# Patient Record
Sex: Male | Born: 1960 | Race: Black or African American | Hispanic: No | Marital: Single | Smoking: Never smoker
Health system: Southern US, Community
[De-identification: ages and names within clinical notes are randomized; demographics above are authoritative.]

## PROBLEM LIST (undated history)

## (undated) DIAGNOSIS — K219 Gastro-esophageal reflux disease without esophagitis: Secondary | ICD-10-CM

## (undated) DIAGNOSIS — T7840XA Allergy, unspecified, initial encounter: Secondary | ICD-10-CM

## (undated) HISTORY — DX: Gastro-esophageal reflux disease without esophagitis: K21.9

## (undated) HISTORY — DX: Allergy, unspecified, initial encounter: T78.40XA

## (undated) HISTORY — PX: TUMOR REMOVAL: SHX12

---

## 1997-10-30 ENCOUNTER — Encounter: Admission: RE | Admit: 1997-10-30 | Discharge: 1997-10-30 | Payer: Self-pay | Admitting: *Deleted

## 2004-07-13 ENCOUNTER — Emergency Department (HOSPITAL_COMMUNITY): Admission: EM | Admit: 2004-07-13 | Discharge: 2004-07-13 | Payer: Self-pay | Admitting: Family Medicine

## 2004-08-10 ENCOUNTER — Emergency Department (HOSPITAL_COMMUNITY): Admission: EM | Admit: 2004-08-10 | Discharge: 2004-08-10 | Payer: Self-pay | Admitting: Family Medicine

## 2004-10-19 ENCOUNTER — Emergency Department (HOSPITAL_COMMUNITY): Admission: EM | Admit: 2004-10-19 | Discharge: 2004-10-19 | Payer: Self-pay | Admitting: Family Medicine

## 2004-11-01 ENCOUNTER — Emergency Department (HOSPITAL_COMMUNITY): Admission: EM | Admit: 2004-11-01 | Discharge: 2004-11-01 | Payer: Self-pay | Admitting: Family Medicine

## 2005-03-29 ENCOUNTER — Emergency Department (HOSPITAL_COMMUNITY): Admission: EM | Admit: 2005-03-29 | Discharge: 2005-03-29 | Payer: Self-pay | Admitting: Family Medicine

## 2005-04-03 ENCOUNTER — Emergency Department (HOSPITAL_COMMUNITY): Admission: EM | Admit: 2005-04-03 | Discharge: 2005-04-03 | Payer: Self-pay | Admitting: Family Medicine

## 2005-04-04 ENCOUNTER — Emergency Department (HOSPITAL_COMMUNITY): Admission: EM | Admit: 2005-04-04 | Discharge: 2005-04-04 | Payer: Self-pay | Admitting: Emergency Medicine

## 2009-04-03 ENCOUNTER — Ambulatory Visit: Payer: Self-pay | Admitting: Internal Medicine

## 2009-04-03 DIAGNOSIS — R21 Rash and other nonspecific skin eruption: Secondary | ICD-10-CM

## 2009-04-03 DIAGNOSIS — D239 Other benign neoplasm of skin, unspecified: Secondary | ICD-10-CM | POA: Insufficient documentation

## 2009-04-03 DIAGNOSIS — N401 Enlarged prostate with lower urinary tract symptoms: Secondary | ICD-10-CM

## 2009-04-06 ENCOUNTER — Telehealth: Payer: Self-pay | Admitting: Internal Medicine

## 2009-04-07 ENCOUNTER — Telehealth: Payer: Self-pay | Admitting: Internal Medicine

## 2009-04-19 ENCOUNTER — Emergency Department (HOSPITAL_COMMUNITY): Admission: EM | Admit: 2009-04-19 | Discharge: 2009-04-20 | Payer: Self-pay | Admitting: Emergency Medicine

## 2010-03-21 LAB — CONVERTED CEMR LAB
ALT: 40 units/L (ref 0–53)
AST: 31 units/L (ref 0–37)
Albumin: 4.5 g/dL (ref 3.5–5.2)
Alkaline Phosphatase: 103 units/L (ref 39–117)
BUN: 10 mg/dL (ref 6–23)
Basophils Absolute: 0.1 10*3/uL (ref 0.0–0.1)
Basophils Relative: 1.2 % (ref 0.0–3.0)
Bilirubin Urine: NEGATIVE
Cholesterol: 220 mg/dL — ABNORMAL HIGH (ref 0–200)
Creatinine, Ser: 1 mg/dL (ref 0.4–1.5)
Direct LDL: 149 mg/dL
Eosinophils Absolute: 0.1 10*3/uL (ref 0.0–0.7)
Eosinophils Relative: 1.4 % (ref 0.0–5.0)
GFR calc non Af Amer: 101.81 mL/min (ref >60)
Glucose, Bld: 94 mg/dL (ref 70–99)
HDL: 52.9 mg/dL (ref >39.00)
Ketones, ur: NEGATIVE mg/dL
Lymphocytes Relative: 28.6 % (ref 12.0–46.0)
Lymphs Abs: 1.6 10*3/uL (ref 0.7–4.0)
Neutro Abs: 3.3 10*3/uL (ref 1.4–7.7)
Neutrophils Relative %: 60.9 % (ref 43.0–77.0)
Nitrite: NEGATIVE
PSA: 1.22 ng/mL (ref 0.10–4.00)
RBC: 5.38 M/uL (ref 4.22–5.81)
TSH: 0.87 microintl units/mL (ref 0.35–5.50)
Urine Glucose: NEGATIVE mg/dL
pH: 6.5 (ref 5.0–8.0)

## 2010-03-23 NOTE — Letter (Signed)
Summary: Lipid Letter  [facility]  [address] [place], [place] [address]   Phone: [phone]  Fax: [phone]    04/03/2009  Jon Holmes [address] [place], [place]  [address]  Dear Jon Holmes:  We have carefully reviewed your last lipid profile from  and the results are noted below with a summary of recommendations for lipid management.    Cholesterol:       220     Goal: <200   HDL "good" Cholesterol:   41.74     Goal: >40   LDL "bad" Cholesterol:   149     Goal: <130   Triglycerides:       66.0     Goal: <150    really good results    TLC Diet (Therapeutic Lifestyle Change): Saturated Fats & Transfatty acids should be kept < 7% of total calories ***Reduce Saturated Fats Polyunstaurated Fat can be up to 10% of total calories Monounsaturated Fat Fat can be up to 20% of total calories Total Fat should be no greater than 25-35% of total calories Carbohydrates should be 50-60% of total calories Protein should be approximately 15% of total calories Fiber should be at least 20-30 grams a day ***Increased fiber may help lower LDL Total Cholesterol should be < 200mg /day Consider adding plant stanol/sterols to diet (example: Benacol spread) ***A higher intake of unsaturated fat may reduce Triglycerides and Increase HDL    Adjunctive Measures (may lower LIPIDS and reduce risk of Heart Attack) include: Aerobic Exercise (20-30 minutes 3-4 times a week) Limit Alcohol Consumption Weight Reduction Aspirin 75-81 mg a day by mouth (if not allergic or contraindicated) Dietary Fiber 20-30 grams a day by mouth     Current Medications:  None If you have any questions, please call. We appreciate being able to work with you.   Sincerely,    [facility] Jon Grandchild MD

## 2010-03-23 NOTE — Progress Notes (Signed)
Summary: RESULTS  Phone Note Call from Patient   Summary of Call: See previous phone note, Patient is requesting results of labs. He called office again requesting results. Initial call taken by: Lamar Sprinkles, CMA,  April 07, 2009 3:10 PM  Follow-up for Phone Call        labs look great all are normal Follow-up by: Etta Grandchild MD,  April 07, 2009 6:49 PM  Additional Follow-up for Phone Call Additional follow up Details #1::        Patient notified per MD and advised that he should be getting result letter this week. Additional Follow-up by: Rock Nephew CMA,  April 09, 2009 9:20 AM

## 2010-03-23 NOTE — Assessment & Plan Note (Signed)
Summary: NEW PT PHY--MED COST--#-PKG--STC   Vital Signs:  Patient profile:   50 year old male Height:      69 inches Weight:      190 pounds BMI:     28.16 O2 Sat:      98 % on Room air Temp:     97.4 degrees F oral Pulse rate:   72 / minute Pulse rhythm:   regular Resp:     16 per minute BP sitting:   130 / 84  (left arm) Cuff size:   large  Vitals Entered By: Rock Nephew CMA (April 03, 2009 10:34 AM)  Nutrition Counseling: Patient's BMI is greater than 25 and therefore counseled on weight management options.  O2 Flow:  Room air CC: New pt cpx Is Patient Diabetic? No Pain Assessment Patient in pain? no        Primary Care Provider:  Etta Grandchild MD  CC:  New pt cpx.  History of Present Illness: New to me for a complete physical, he complains of abnormal moles on the palm of his left hand and his left abd. for 3-4 years. He also has a rash (dark spots) on both hands (palms).  Preventive Screening-Counseling & Management  Alcohol-Tobacco     Smoking Status: never  Caffeine-Diet-Exercise     Does Patient Exercise: yes      Drug Use:  no.    Current Medications (verified): 1)  None  Allergies (verified): No Known Drug Allergies  Past History:  Past Medical History: Unremarkable  Past Surgical History: cyst removed from right mid-back several years ago in [place].  Family History: Family History of Alcoholism/Addiction Family History Hypertension Family History of Stroke M 1st degree relative <50  Social History: Occupation: Sales executive Single Never Smoked Alcohol use-no Drug use-no Regular exercise-yes Smoking Status:  never Drug Use:  no Does Patient Exercise:  yes  Review of Systems       The patient complains of suspicious skin lesions.  The patient denies anorexia, fever, weight loss, weight gain, chest pain, syncope, dyspnea on exertion, peripheral edema, prolonged cough, headaches, hemoptysis, abdominal pain, melena,  hematochezia, severe indigestion/heartburn, hematuria, incontinence, genital sores, difficulty walking, depression, abnormal bleeding, enlarged lymph nodes, angioedema, and testicular masses.   General:  Denies chills, fatigue, fever, sweats, and weakness. GI:  Complains of constipation; denies abdominal pain, bloody stools, diarrhea, hemorrhoids, indigestion, loss of appetite, nausea, vomiting, vomiting blood, and yellowish skin color. GU:  Denies decreased libido, discharge, dysuria, erectile dysfunction, genital sores, hematuria, incontinence, nocturia, urinary frequency, and urinary hesitancy. Derm:  Complains of rash; denies changes in color of skin, changes in nail beds, dryness, excessive perspiration, flushing, hair loss, insect bite(s), itching, lesion(s), and poor wound healing.  Physical Exam  General:  Well-developed,well-nourished,in no acute distress; alert,appropriate and cooperative throughout examination Head:  Normocephalic and atraumatic without obvious abnormalities. No apparent alopecia or balding. Eyes:  No corneal or conjunctival inflammation noted. EOMI. Perrla. Funduscopic exam benign, without hemorrhages, exudates or papilledema. Vision grossly normal. Ears:  R ear normal and L ear normal.   Mouth:  Oral mucosa and oropharynx without lesions or exudates.  Teeth in good repair. Neck:  No deformities, masses, or tenderness noted. Chest Wall:  No deformities, masses, tenderness or gynecomastia noted. Breasts:  No masses or gynecomastia noted Lungs:  Normal respiratory effort, chest expands symmetrically. Lungs are clear to auscultation, no crackles or wheezes. Heart:  Normal rate and regular rhythm. S1 and S2 normal without gallop,  murmur, click, rub or other extra sounds. Abdomen:  Bowel sounds positive,abdomen soft and non-tender without masses, organomegaly or hernias noted. Rectal:  No external abnormalities noted. Normal sphincter tone. No rectal masses or tenderness.  heme neg. stool. Genitalia:  circumcised, no hydrocele, no varicocele, no scrotal masses, no cutaneous lesions, no urethral discharge, R testes atrophic, and L testes atrophic.   Prostate:  no nodules, no asymmetry, no induration, and 1+ enlarged.   Msk:  No deformity or scoliosis noted of thoracic or lumbar spine.   Pulses:  R and L carotid,radial,femoral,dorsalis pedis and posterior tibial pulses are full and equal bilaterally Extremities:  No clubbing, cyanosis, edema, or deformity noted with normal full range of motion of all joints.   Neurologic:  No cranial nerve deficits noted. Station and gait are normal. Plantar reflexes are down-going bilaterally. DTRs are symmetrical throughout. Sensory, motor and coordinative functions appear intact. Skin:  macular rash on both palms  and dysplastic nevi on left palm, left lower abd., and both ankles and feet.   Cervical Nodes:  no anterior cervical adenopathy and no posterior cervical adenopathy.   Axillary Nodes:  no R axillary adenopathy and no L axillary adenopathy.   Inguinal Nodes:  no R inguinal adenopathy and no L inguinal adenopathy.   Psych:  Cognition and judgment appear intact. Alert and cooperative with normal attention span and concentration. No apparent delusions, illusions, hallucinations Additional Exam:  EKG has some artifact but otherwise looks good.   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) Assessment New  Orders: Dermatology Referral (Derma)  Problem # 2:  RASH AND OTHER NONSPECIFIC SKIN ERUPTION (ICD-782.1) Assessment: New  Orders: Venipuncture (29562) TLB-Lipid Panel (80061-LIPID) TLB-BMP (Basic Metabolic Panel-BMET) (80048-METABOL) TLB-CBC Platelet - w/Differential (85025-CBCD) TLB-Hepatic/Liver Function Pnl (80076-HEPATIC) TLB-TSH (Thyroid Stimulating Hormone) (84443-TSH) TLB-PSA (Prostate Specific Antigen) (84153-PSA) TLB-Udip w/ Micro (81001-URINE) T-RPR (Syphilis)  (13086-57846)  Problem # 3:  ROUTINE GENERAL MEDICAL EXAM@HEALTH  CARE FACL (ICD-V70.0) Assessment: New  Orders: Venipuncture ([address]295) TLB-Lipid Panel (80061-LIPID) TLB-BMP (Basic Metabolic Panel-BMET) (80048-METABOL) TLB-CBC Platelet - w/Differential (85025-CBCD) TLB-Hepatic/Liver Function Pnl (80076-HEPATIC) TLB-TSH (Thyroid Stimulating Hormone) (84443-TSH) TLB-PSA (Prostate Specific Antigen) (84153-PSA) TLB-Udip w/ Micro (81001-URINE) T-RPR (Syphilis) (28413-24401) EKG w/ Interpretation (93000) Hemoccult Guaiac-1 spec.(in office) (82270)  Discussed using sunscreen, use of alcohol, drug use, self testicular exam, routine dental care, routine eye care, routine physical exam, seat belts, multiple vitamins,  and recommendations for immunizations.  Discussed exercise and checking cholesterol.  Discussed gun safety, safe sex, and contraception. Also recommend checking PSA.  Problem # 4:  HYPERTROPHY PROSTATE W/UR OBST & OTH LUTS (ICD-600.01) Assessment: New  Patient Instructions: 1)  Please schedule a follow-up appointment in 1 month. 2)  Schedule a colonoscopy/sigmoidoscopy to help detect colon cancer. 3)  If you could be exposed to sexually transmitted diseases, you should use a condom.   Immunization History:  Tetanus/Td Immunization History:    Tetanus/Td:  tdap (10/03/2006)  Not Administered:    Influenza Vaccine not given due to: declined

## 2010-03-23 NOTE — Progress Notes (Signed)
Summary: RESULTS  Phone Note Call from Patient Call back at Home Phone [phone] Call back at [facility] #   Summary of Call: Patient is requesting results of labs.  Initial call taken by: Lamar Sprinkles, CMA,  April 06, 2009 2:26 PM

## 2010-05-12 LAB — D-DIMER, QUANTITATIVE: D-Dimer, Quant: 0.34 ug/mL-FEU (ref 0.00–0.48)

## 2010-05-12 LAB — POCT CARDIAC MARKERS: Troponin i, poc: <0.05 ng/mL (ref 0.00–0.09)

## 2011-03-04 ENCOUNTER — Encounter (HOSPITAL_COMMUNITY): Payer: Self-pay | Admitting: *Deleted

## 2011-03-04 ENCOUNTER — Emergency Department (HOSPITAL_COMMUNITY)
Admission: EM | Admit: 2011-03-04 | Discharge: 2011-03-04 | Disposition: A | Discharge status: Home / Self Care | Payer: BC Managed Care – PPO | Attending: Emergency Medicine | Admitting: Emergency Medicine

## 2011-03-04 ENCOUNTER — Emergency Department (HOSPITAL_COMMUNITY): Payer: BC Managed Care – PPO

## 2011-03-04 DIAGNOSIS — L234 Allergic contact dermatitis due to dyes: Secondary | ICD-10-CM

## 2011-03-04 DIAGNOSIS — R220 Localized swelling, mass and lump, head: Secondary | ICD-10-CM | POA: Insufficient documentation

## 2011-03-04 DIAGNOSIS — K047 Periapical abscess without sinus: Secondary | ICD-10-CM | POA: Insufficient documentation

## 2011-03-04 DIAGNOSIS — L258 Unspecified contact dermatitis due to other agents: Secondary | ICD-10-CM | POA: Insufficient documentation

## 2011-03-04 MED ORDER — PENICILLIN V POTASSIUM 250 MG PO TABS: 250.0000 mg | ORAL_TABLET | Freq: Four times a day (QID) | ORAL | Status: AC

## 2011-03-04 MED ORDER — DEXAMETHASONE SODIUM PHOSPHATE 10 MG/ML IJ SOLN
10.0000 mg | Freq: Once | INTRAMUSCULAR | Status: AC
  Administered 2011-03-04: 10 mg via INTRAMUSCULAR
  Filled 2011-03-04: qty 1

## 2011-03-04 MED ORDER — DIPHENHYDRAMINE HCL 25 MG PO CAPS
25.0000 mg | ORAL_CAPSULE | Freq: Once | ORAL | Status: AC
  Administered 2011-03-04: 25 mg via ORAL
  Filled 2011-03-04: qty 1

## 2011-03-04 MED ORDER — IOHEXOL 300 MG/ML  SOLN
80.0000 mL | Freq: Once | INTRAMUSCULAR | Status: AC | PRN
  Administered 2011-03-04: 80 mL via INTRAVENOUS

## 2011-03-04 NOTE — ED Provider Notes (Signed)
Medical screening examination/treatment/procedure(s) were conducted as a shared visit with non-physician practitioner(s) and myself.  I personally evaluated the patient during the encounter   Promiss Labarbera A. Patrica Duel, MD 03/04/11 1610

## 2011-03-04 NOTE — ED Provider Notes (Signed)
History     CSN: [id number]  Arrival date & time 03/04/11  1312   First MD Initiated Contact with Patient 03/04/11 1443      Chief Complaint  Patient presents with  . Allergic Reaction   pleasant 51 year old gentleman reported facial swelling after using a hair product, hair dye, 2 days ago. He states the left side of his face has gone down but the right side of his face is still swollen. He's had no oral involvement of tongue or throat. He denies any recent dental infection or any other infection. No headaches. He does have some discomfort with chewing. Patient also was concerned because he states he was told 2 years ago that he had some "lymph nodes" Denies any chest pain, shortness of breath, or any neck pain. He does state that the swelling has gone down quite a bit over the past 2 days  (Consider location/radiation/quality/duration/timing/severity/associated sxs/prior treatment) HPI  History reviewed. No pertinent past medical history.  History reviewed. No pertinent past surgical history.  History reviewed. No pertinent family history.  History  Substance Use Topics  . Smoking status: Never Smoker   . Smokeless tobacco: Not on file  . Alcohol Use:       Review of Systems  All other systems reviewed and are negative.    Allergies  Shellfish allergy  Home Medications  No current outpatient prescriptions on file.  BP 155/93  Pulse 87  Temp(Src) 97.6 F (36.4 C) (Oral)  Resp 16  SpO2 99%  Physical Exam  Nursing note and vitals reviewed. Constitutional: He is oriented to person, place, and time. He appears well-developed and well-nourished.  HENT:  Head: Atraumatic.       Puffiness around the eyes. Puffiness on the right portion of the face in the soft tissues. Sinuses are nontender oral pharynx is clear. No obvious dental infection. No crepitus or lymphadenopathy in the neck.  Eyes: Conjunctivae and EOM are normal. Pupils are equal, round, and reactive to  light.  Neck: Neck supple.  Cardiovascular: Normal rate and regular rhythm.  Exam reveals no gallop and no friction rub.   No murmur heard. Pulmonary/Chest: Breath sounds normal. He has no wheezes. He has no rales. He exhibits no tenderness.  Abdominal: Soft. Bowel sounds are normal. He exhibits no distension. There is no tenderness. There is no rebound and no guarding.  Musculoskeletal: Normal range of motion.  Neurological: He is alert and oriented to person, place, and time. No cranial nerve deficit. Coordination normal.  Skin: Skin is warm and dry. No rash noted.       Some small bumps on the top of the scalp, maculopapular. No erythema.  Psychiatric: He has a normal mood and affect.    ED Course  Procedures (including critical care time)  Labs Reviewed - No data to display No results found.   No diagnosis found.    MDM  Pt is seen and examined;  Initial history and physical completed.  Will follow.          Teshara Moree A. Patrica Duel, MD 03/04/11 1954

## 2011-03-04 NOTE — ED Notes (Signed)
To ed for eval of facial swelling after having an allergic rx to hair dye (pt thinks) 2 days ago. Pt denies taking otc meds and states the swelling has become less. No airway compromise noted. Appears in nad

## 2011-03-04 NOTE — ED Notes (Signed)
Dinner tray ordered. Ok per EDP  

## 2011-03-04 NOTE — ED Provider Notes (Signed)
Radiology results reviewed and discussed with patient and with Dr. Patrica Duel.  Periapical abscess incidentally noted on CT--will provide antibx rx--patient has a dentist to follow-up with.  Jimmye Norman, NP 03/04/11 1913

## 2012-02-19 ENCOUNTER — Encounter (HOSPITAL_COMMUNITY): Payer: Self-pay | Admitting: Emergency Medicine

## 2012-02-19 ENCOUNTER — Emergency Department (HOSPITAL_COMMUNITY)
Admission: EM | Admit: 2012-02-19 | Discharge: 2012-02-19 | Disposition: A | Discharge status: Home / Self Care | Payer: BC Managed Care – PPO | Attending: Emergency Medicine | Admitting: Emergency Medicine

## 2012-02-19 DIAGNOSIS — L259 Unspecified contact dermatitis, unspecified cause: Secondary | ICD-10-CM

## 2012-02-19 DIAGNOSIS — L258 Unspecified contact dermatitis due to other agents: Secondary | ICD-10-CM | POA: Insufficient documentation

## 2012-02-19 MED ORDER — DEXAMETHASONE 2 MG PO TABS
2.0000 mg | ORAL_TABLET | Freq: Once | ORAL | Status: AC
  Administered 2012-02-19: 2 mg via ORAL
  Filled 2012-02-19: qty 1

## 2012-02-19 NOTE — ED Notes (Signed)
Pt presents with reaction from hair dye product on scalp.  Pt dyed his hair 2 days ago and 4 hours afterwards he noticed itching and swelling to his scalp, states symptoms have not improved since.  No respiratory distress noted.

## 2012-02-19 NOTE — ED Provider Notes (Signed)
Patient with History    This chart was scribed for [employer]. Rubin Payor, MD, MD by Smitty Pluck, ED Scribe. The patient was seen in room TR11C and the patient's care was started at 5:20 PM.   CSN: [id number]  Arrival date & time 02/19/12  1504      Chief Complaint  Patient presents with  . Allergic Reaction    (Consider location/radiation/quality/duration/timing/severity/associated sxs/prior treatment) The history is provided by the patient. No language interpreter was used.   Jon Holmes is a 51 y.o. male who presents to the Emergency Department complaining of moderate allergic reaction onset 4 days ago. Pt reports tnat he used new hair dye and noticed constant, moderate facial swelling onset 4 hours after using the product. He reports that his scalp itches. He denies SOB, trouble swallowing, nausea, vomiting, diarrhea and any other symptoms.   History reviewed. No pertinent past medical history.  History reviewed. No pertinent past surgical history.  No family history on file.  History  Substance Use Topics  . Smoking status: Never Smoker   . Smokeless tobacco: Not on file  . Alcohol Use: Yes      Review of Systems  Constitutional: Negative for fever and chills.  HENT: Negative for trouble swallowing.   Respiratory: Negative for shortness of breath.   Gastrointestinal: Negative for nausea, vomiting and diarrhea.  Neurological: Negative for weakness.  All other systems reviewed and are negative.    Allergies  Shellfish allergy  Home Medications   Current Outpatient Rx  Name  Route  Sig  Dispense  Refill  . DIPHENHYDRAMINE HCL 25 MG PO TABS   Oral   Take 50 mg by mouth every 6 (six) hours as needed. For swelling           BP 138/82  Pulse 64  Temp 98 F (36.7 C)  Resp 16  SpO2 99%  Physical Exam  Nursing note and vitals reviewed. Constitutional: He is oriented to person, place, and time. He appears well-developed and well-nourished. No distress.    HENT:  Head: Normocephalic and atraumatic.       Mild swelling to temporal area Minimal scaling on scalp  Eyes: Conjunctivae normal and EOM are normal.  Neck: Neck supple. No tracheal deviation present.  Cardiovascular: Normal rate, regular rhythm and normal heart sounds.   Pulmonary/Chest: Effort normal and breath sounds normal. No respiratory distress. He has no wheezes. He has no rales.  Musculoskeletal: Normal range of motion.  Neurological: He is alert and oriented to person, place, and time.  Skin: Skin is warm and dry.  Psychiatric: He has a normal mood and affect. His behavior is normal.    ED Course  Procedures (including critical care time)  COORDINATION OF CARE: 5:22 PM Discussed ED treatment with pt     Labs Reviewed - No data to display No results found.   1. Contact dermatitis       MDM  Patient with apparent contact dermatitis from hair dye. No infection at this time. States that there is some swelling, however there is minimal in physical exam. We'll treat with oral Decadron since he just wants one dose. He'll be discharged home and followup as needed     I personally performed the services described in this documentation, which was scribed in my presence. The recorded information has been reviewed and is accurate.     Juliet Rude. Rubin Payor, MD 02/19/12 [phone]

## 2013-01-21 ENCOUNTER — Ambulatory Visit (INDEPENDENT_AMBULATORY_CARE_PROVIDER_SITE_OTHER): Payer: BC Managed Care – PPO | Admitting: Emergency Medicine

## 2013-01-21 VITALS — BP 112/80 | HR 72 | Temp 98.3°F | Resp 16 | Ht 69.5 in | Wt 180.0 lb

## 2013-01-21 DIAGNOSIS — K5900 Constipation, unspecified: Secondary | ICD-10-CM

## 2013-01-21 DIAGNOSIS — L0501 Pilonidal cyst with abscess: Secondary | ICD-10-CM

## 2013-01-21 MED ORDER — CIPROFLOXACIN HCL 500 MG PO TABS: 500.0000 mg | ORAL_TABLET | Freq: Two times a day (BID) | ORAL | Status: DC

## 2013-01-21 MED ORDER — POLYETHYLENE GLYCOL 3350 17 GM/SCOOP PO POWD: 17.0000 g | Freq: Every day | ORAL | Status: DC

## 2013-01-21 NOTE — Progress Notes (Signed)
[facility] [address], [place] [address] [phone]  Date:  01/21/2013   Name:  Jon Holmes   DOB:  05-05-1960   MRN:  [id number]  PCP:  Sanda Linger, MD    Chief Complaint: Hemorrhoids   History of Present Illness:  Jon Holmes is a 52 y.o. very pleasant male patient who presents with the following:  Has pain with stool and some drainage.  Thinks he has a hemorrhoid.  No bleeding.  No colonoscopy.  No fever or chills.  No history of abscess.  No improvement with over the counter medications or other home remedies. Denies other complaint or health concern today.   Patient Active Problem List   Diagnosis Date Noted  . 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 HYPERTROPHY PROSTATE W/UR OBST & OTH LUTS 04/03/2009  . RASH AND OTHER NONSPECIFIC SKIN ERUPTION 04/03/2009    Past Medical History  Diagnosis Date  . Allergy     No past surgical history on file.  History  Substance Use Topics  . Smoking status: Never Smoker   . Smokeless tobacco: Not on file  . Alcohol Use: Yes    No family history on file.  Allergies  Allergen Reactions  . Shellfish Allergy     Medication list has been reviewed and updated.  No current outpatient prescriptions on file prior to visit.   No current facility-administered medications on file prior to visit.    Review of Systems:  As per HPI, otherwise negative.    Physical Examination: Filed Vitals:   01/21/13 1529  BP: 112/80  Pulse: 72  Temp: 98.3 F (36.8 C)  Resp: 16   Filed Vitals:   01/21/13 1529  Height: 5' 9.5" (1.765 m)  Weight: 180 lb (81.647 kg)   Body mass index is 26.21 kg/(m^2). Ideal Body Weight: Weight in (lb) to have BMI = 25: 171.4   GEN: WDWN, NAD, Non-toxic, Alert & Oriented x 3 HEENT: Atraumatic, Normocephalic.  Ears and Nose: No external deformity. EXTR: No clubbing/cyanosis/edema NEURO: Normal gait.  PSYCH: Normally interactive. Conversant. Not depressed or anxious  appearing.  Calm demeanor.  Rectal:  Pilonidal cyst abscess.  Assessment and Plan: Constipation Colon and rectal referral miralax cipro  Signed,  Phillips Odor, MD

## 2013-01-21 NOTE — Patient Instructions (Signed)
Pilonidal Cyst A pilonidal cyst occurs when hairs get trapped (ingrown) beneath the skin in the crease between the buttocks over your sacrum (the bone under that crease). Pilonidal cysts are most common in young men with a lot of body hair. When the cyst is ruptured (breaks) or leaking, fluid from the cyst may cause burning and itching. If the cyst becomes infected, it causes a painful swelling filled with pus (abscess). The pus and trapped hairs need to be removed (often by lancing) so that the infection can heal. However, recurrence is common and an operation may be needed to remove the cyst. HOME CARE INSTRUCTIONS   If the cyst was NOT INFECTED:  Keep the area clean and dry. Bathe or shower daily. Wash the area well with a germ-killing soap. Warm tub baths may help prevent infection and help with drainage. Dry the area well with a towel.  Avoid tight clothing to keep area as moisture free as possible.  Keep area between buttocks as free of hair as possible. A depilatory may be used.  If the cyst WAS INFECTED and needed to be drained:  Your caregiver packed the wound with gauze to keep the wound open. This allows the wound to heal from the inside outwards and continue draining.  Return for a wound check in 1 day or as suggested.  If you take tub baths or showers, repack the wound with gauze following them. Sponge baths (at the sink) are a good alternative.  If an antibiotic was ordered to fight the infection, take as directed.  Only take over-the-counter or prescription medicines for pain, discomfort, or fever as directed by your caregiver.  After the drain is removed, use sitz baths for 20 minutes 4 times per day. Clean the wound gently with mild unscented soap, pat dry, and then apply a dry dressing. SEEK MEDICAL CARE IF:   You have increased pain, swelling, redness, drainage, or bleeding from the area.  You have a fever.  You have muscles aches, dizziness, or a general ill  feeling. Document Released: 02/05/2000 Document Revised: 05/02/2011 Document Reviewed: 04/04/2008 Exit[employer] Patient Information 2014 [employer].  

## 2013-01-23 ENCOUNTER — Encounter (INDEPENDENT_AMBULATORY_CARE_PROVIDER_SITE_OTHER): Payer: Self-pay | Admitting: General Surgery

## 2013-01-23 ENCOUNTER — Ambulatory Visit (INDEPENDENT_AMBULATORY_CARE_PROVIDER_SITE_OTHER): Payer: BC Managed Care – PPO | Admitting: General Surgery

## 2013-01-23 VITALS — BP 114/80 | HR 80 | Temp 97.6°F | Resp 15 | Ht 68.0 in | Wt 180.0 lb

## 2013-01-23 DIAGNOSIS — L988 Other specified disorders of the skin and subcutaneous tissue: Secondary | ICD-10-CM

## 2013-01-23 NOTE — Progress Notes (Signed)
Subjective:     Patient ID: Jon Holmes, male   DOB: March 06, 1960, 52 y.o.   MRN: [id number]  HPI This is a 52 year old male who is otherwise healthy. He has had some constipation for which she is now on MiraLAX. About 3 weeks ago he started noticing a mass just superior to his anus. This got larger and caused pain and had some drainage at that time. He has no prior history of this. He was seen in urgent care 3 days ago and begun on antibiotics. He is still having some trouble with bowel movements. He denies any fevers.  Review of Systems     Objective:   Physical Exam Superior to his anus right in his gluteal cleft there is about a 0.5 x 1.5 cm shallow appears to be an ulceration. I'm not sure if this was an abscess or pilonidal disease that is drained and is now healing.     Assessment:     Possible pilonidal disease versus ulceration     Plan:     He says is better with some antibiotics. This may end up needing at least a biopsy and a concern that it may not be able to just close on its own to this location. I will plan on just observing this for the next week. I would have him come back and see me in one week. This is not considerably better than I would consider doing a punch biopsy of the lesion. We'll also may be need to consider coverage for this area if it does not heal over the long-term also.

## 2013-02-01 ENCOUNTER — Encounter (INDEPENDENT_AMBULATORY_CARE_PROVIDER_SITE_OTHER): Payer: BC Managed Care – PPO | Admitting: General Surgery

## 2013-02-08 ENCOUNTER — Telehealth (INDEPENDENT_AMBULATORY_CARE_PROVIDER_SITE_OTHER): Payer: Self-pay

## 2013-02-08 NOTE — Telephone Encounter (Signed)
LMOM notifying pt that his appt with Dr Dwain Sarna for 12/22 has been canceled per the request of Dr Dwain Sarna he wants the pt to see Dr Maisie Fus in our group. I have r/s the appt to 02/27/13 arrive 3:30/3:50 with Dr Maisie Fus. I ask for the pt to call me so I can explain to the pt if needed.

## 2013-02-11 ENCOUNTER — Encounter (INDEPENDENT_AMBULATORY_CARE_PROVIDER_SITE_OTHER): Payer: BC Managed Care – PPO | Admitting: General Surgery

## 2013-02-27 ENCOUNTER — Encounter (INDEPENDENT_AMBULATORY_CARE_PROVIDER_SITE_OTHER): Payer: BC Managed Care – PPO | Admitting: General Surgery

## 2013-03-05 ENCOUNTER — Encounter (INDEPENDENT_AMBULATORY_CARE_PROVIDER_SITE_OTHER): Payer: Self-pay | Admitting: General Surgery

## 2013-03-13 ENCOUNTER — Emergency Department (HOSPITAL_COMMUNITY)
Admission: EM | Admit: 2013-03-13 | Discharge: 2013-03-13 | Disposition: A | Discharge status: Home / Self Care | Payer: No Typology Code available for payment source | Attending: Emergency Medicine | Admitting: Emergency Medicine

## 2013-03-13 ENCOUNTER — Encounter (HOSPITAL_COMMUNITY): Payer: Self-pay | Admitting: Emergency Medicine

## 2013-03-13 DIAGNOSIS — R5381 Other malaise: Secondary | ICD-10-CM | POA: Insufficient documentation

## 2013-03-13 DIAGNOSIS — M791 Myalgia, unspecified site: Secondary | ICD-10-CM

## 2013-03-13 DIAGNOSIS — Z9109 Other allergy status, other than to drugs and biological substances: Secondary | ICD-10-CM | POA: Insufficient documentation

## 2013-03-13 DIAGNOSIS — Z792 Long term (current) use of antibiotics: Secondary | ICD-10-CM | POA: Insufficient documentation

## 2013-03-13 DIAGNOSIS — M2559 Pain in other specified joint: Secondary | ICD-10-CM | POA: Insufficient documentation

## 2013-03-13 DIAGNOSIS — IMO0001 Reserved for inherently not codable concepts without codable children: Secondary | ICD-10-CM | POA: Insufficient documentation

## 2013-03-13 DIAGNOSIS — R21 Rash and other nonspecific skin eruption: Secondary | ICD-10-CM | POA: Insufficient documentation

## 2013-03-13 DIAGNOSIS — J029 Acute pharyngitis, unspecified: Secondary | ICD-10-CM | POA: Insufficient documentation

## 2013-03-13 DIAGNOSIS — M2550 Pain in unspecified joint: Secondary | ICD-10-CM

## 2013-03-13 DIAGNOSIS — R5383 Other fatigue: Secondary | ICD-10-CM

## 2013-03-13 DIAGNOSIS — R509 Fever, unspecified: Secondary | ICD-10-CM | POA: Insufficient documentation

## 2013-03-13 MED ORDER — OMEPRAZOLE 20 MG PO CPDR: 20.0000 mg | DELAYED_RELEASE_CAPSULE | Freq: Every day | ORAL | Status: DC

## 2013-03-13 MED ORDER — HYDROCORTISONE 2.5 % EX LOTN: TOPICAL_LOTION | Freq: Two times a day (BID) | CUTANEOUS | Status: DC

## 2013-03-13 NOTE — Discharge Instructions (Signed)
°Emergency Department Resource Guide °1) Find a Doctor and Pay Out of Pocket °Although you won't have to find out who is covered by your insurance plan, it is a good idea to ask around and get recommendations. You will then need to call the office and see if the doctor you have chosen will accept you as a new patient and what types of options they offer for patients who are self-pay. Some doctors offer discounts or will set up payment plans for their patients who do not have insurance, but you will need to ask so you aren't surprised when you get to your appointment. ° °2) Contact Your Local Health Department °Not all health departments have doctors that can see patients for sick visits, but many do, so it is worth a call to see if yours does. If you don't know where your local health department is, you can check in your phone book. The CDC also has a tool to help you locate your state's health department, and many state websites also have listings of all of their local health departments. ° °3) Find a Walk-in Clinic °If your illness is not likely to be very severe or complicated, you may want to try a walk in clinic. These are popping up all over the country in pharmacies, drugstores, and shopping centers. They're usually staffed by nurse practitioners or physician assistants that have been trained to treat common illnesses and complaints. They're usually fairly quick and inexpensive. However, if you have serious medical issues or chronic medical problems, these are probably not your best option. ° °No Primary Care Doctor: °- Call [facility] at  832-8000 - they can help you locate a primary care doctor that  accepts your insurance, provides certain services, etc. °- Physician Referral Service- 1-800-533-3463 ° °Chronic Pain Problems: °Organization         Address  Phone   Notes  °[facility]  (336) 297-2271 Patients need to be referred by their primary care doctor.  ° °Medication  Assistance: °Organization         Address  Phone   Notes  °[place] [facility] [address] °[place], [place] [address] (336) 641-8030 --Must be a resident of [place] °-- Must have NO insurance coverage whatsoever (no Medicaid/ Medicare, etc.) °-- The pt. MUST have a primary care doctor that directs their care regularly and follows them in the community °  °[facility]  (866) 331-1348   °[employer]  (888) 892-1162   ° °Agencies that provide inexpensive medical care: °Organization         Address  Phone   Notes  °[facility]  (336) 832-8035   °[facility]    (336) 832-7272   °[facility] [address] °[place], [place] [address] (336) 832-4777   °[facility] [address], °[place] (336) 271-4999   °[facility]    (336) 373-0678   °[facility]    (336) 272-1050   °[facility] ° [address], [place] Phone:  (336) 832-4444, Fax:  (336) 832-4440 Hours of Operation:  9 am - 6 pm, M-F.  Also accepts Medicaid/Medicare and self-pay.  ° [facility] ° [address],  Phone: (336) 832-3150, Fax: (336) 832-3151. Hours of Operation:  8:30 am - 5:30 pm, M-F.  Also accepts Medicaid and self-pay.  °[facility] [place] [address], [place] Phone: (336) 878-6027   °[facility] [address], [place], [place] (336)723-1848, Ext. 123 Mondays & Thursdays: 7-9 AM.  First 15 patients are seen on a first come, first serve basis. °  ° °Medicaid-accepting [place] Providers: ° °Organization         Address  Phone   Notes  °[facility] [address], [place] (336) 641-2100 Also accepts self-pay patients.  °[facility] [address], [place] ° (336) 856-9996   °[facility] [address], [place]  (336) 288-8857   °[facility] [address] [place] [address], [place] (336) 299-7000   °Veita Bland [address], [place]  ° (336) 373-1557 Only accepts [place] Access Medicaid patients after they have their name applied to their card.  ° °Self-Pay (no insurance) in [place]: ° °Organization         Address  Phone   Notes  °Sickle Cell Patients, [facility] [address], [place] (336) 832-1970   °[facility] [address], [place] (336) 832-4400   °[facility] ° [address], [place] (336) 992-4800   °[facility]/Dr. Osei-Bonsu ° [address] [place] [address], [place] or [address], [place] (336) 841-8500 Phone number for both [place] and [place] locations is the same.  °[facility] [address], [place] (336) 299-0000   °[facility] [address] [place] [address], [place] or [address] (336) 852-7530 °(336) 878-2260   °[facility] [address], [place] (336) 350-1642, phone; (336) 294-5005, fax Sees patients 1st and 3rd Saturday of every month.  Must not qualify for public or private insurance (i.e. Medicaid, Medicare, [place] Health Choice, Veterans' Benefits) • Household income should be no more than 200% of the poverty level •The clinic cannot treat you if you are pregnant or think you are pregnant • Sexually transmitted diseases are not treated at the clinic.  ° ° °Dental Care: °Organization         Address  Phone  Notes  °[place] [facility] [address], [place] (336) 641-6152 Accepts children up to age 21 who are enrolled in Medicaid or [place] Health Choice; pregnant women with a Medicaid card; and children who have applied for Medicaid or [place] Health Choice, but were declined, whose parents can pay a reduced fee at time of service.  °[place]  [facility] [place]  [address], [place] (336) 641-7733 Accepts children up to age 21 who are enrolled in Medicaid or [place] Health Choice; pregnant women with a Medicaid card; and children who have applied for Medicaid or [place] Health Choice, but were declined, whose parents can pay a reduced fee at time of service.  °[facility] ° [address],  (336) 641-4533 Patients are seen by appointment only. Walk-ins are not accepted. [facility] will see patients 18 years of age and older. °Monday - Tuesday (8am-5pm) °Most Wednesdays (8:30-5pm) °$30 per visit, cash only  °[facility] ° [address], [place] (336) 641-4533 Patients are seen by appointment only. Walk-ins are not accepted. [facility] will see patients 18 years of age and older. °One   Wednesday Evening (Monthly: Volunteer Based).  $30 per visit, cash only  °[facility]  (919) 537-3737 for adults; Children under age 4, call Graduate Pediatric Dentistry at (919) [phone]. Children aged 4-14, please call (919) [phone] to request a pediatric application. ° Dental services are provided in all areas of dental care including fillings, crowns and bridges, complete and partial dentures, implants, gum treatment, root canals, and extractions. Preventive care is also provided. Treatment is provided to both adults and children. °Patients are selected via a lottery and there is often a waiting list. °  °[facility] [address], °[place] ° (336) 763-8833 [email or web address] °  °[facility] [address], [place], [place] (336)723-1848, Ext. 123 Second and Fourth Thursday of each month, opens at 6:30 AM; Clinic ends at 9 AM.  Patients are seen on a first-come first-served basis, and a limited number are seen during each clinic.  ° °[facility] ° [address], [place], [place] (336) 723-7904    Eligibility Requirements °You must have lived in [place], [place], or [place] counties for at least the last three months. °  You cannot be eligible for state or federal sponsored healthcare insurance, including Veterans Administration, Medicaid, or Medicare. °  You generally cannot be eligible for healthcare insurance through your employer.  °  How to apply: °Eligibility screenings are held every Tuesday and Wednesday afternoon from 1:00 pm until 4:00 pm. You do not need an appointment for the interview!  °[facility] [address], [place], [place] 336-631-2330   °[facility] [facility]  336-342-8273   °[facility]  336-703-3100   °[facility]  336-570-6415   ° °Behavioral Health Resources in the Community: °Intensive Outpatient Programs °Organization         Address  Phone  Notes  °[facility] [address], [place], [place] 336-878-6098   °[facility] [address], [place], [place] 336-832-9800   °[facility] [address], [place], [place] ° 336-882-2125   °[facility] [address],  °[place], [place] 1-800-853-5163 or 336-641-4981   °Substance Abuse Resources °Organization         Address  Phone  Notes  °[facility]  336-882-2125   °[facility]  336-784-9470   °[facility]  336-285-9073   °[facility]  336-845-3988   °Residential & Outpatient Substance Abuse Program  1-800-659-3381   °Psychological Services °Organization         Address  Phone  Notes  °[facility]  336- 832-9600   °[facility]  336- 378-7881   °[facility] [address], [place] 1-800-853-5163 or 336-641-4981   ° °Mobile Crisis Teams °Organization         Address  Phone  Notes  °[facility], Mobile Crisis Care Unit  1-877-626-1772   °Assertive °[facility] ° [address]  [place], [place] 336-834-9664   °Sharon DeEsch [address] °[place] 336-554-5454   ° °Self-Help/Support Groups °Organization         Address  Phone             Notes  °[facility] - variety of support groups  336- 373-1402 Call for more information  °Narcotics Anonymous (NA), [facility] [address], °[place]   2 meetings at this location  ° °  Residential Treatment Programs °Organization         Address  Phone  Notes  °[facility] [address],    °[place]  1-866-801-8205   °[facility] ° [address], [place], [place] 704-293-8524   °[facility] [address], [place] 336-845-3988 Admissions: 8am-3pm M-F  °[facility] [address]    °[place], [place] 336-841-1104   °[facility] [address], [place], [place] 336-379-7146   °[facility] [address]  °[place], [place] 336-285-9073   °[facility] Intensive Outpatient [address], [place], [place] 336-852-3033   °[facility] [address]  °[place], [place] 1-877-615-2722 or 336-784-9470   °[facility] [address] [place], [place] 336-227-7417 Accepts Medicaid  °[facility] [address]  °[place] 1-800-659-3381 Substance Abuse/Addiction Treatment  ° °[place] Behavioral Health Resources °Organization         Address  Phone  Notes  °[facility]  (888) 581-9988   °Julie Brannon, PhD [address] [place], [place]   (336) 349-5553 or (336) 951-0000   °[facility]   [address] °[place], [place] (336) 349-4454   °[facility] [address], [place], [place] (336) 342-8316 Insurance/Medicaid/sponsorship through [facility]  °[facility] [address]                                    [place], [place] (336) 342-8316 Therapy/tele-psych/case    °[facility] [address]  ° [place], [place] (336) 349-2233    °Dr. Arfeen  (336) 349-4544   °[facility] [place]  [employer] [place] [facility] 1) [address], [place] °2) [address], [place] °3)  [address], [place] (336) 349-3220 °(336) 342-7768 ° °(336) 342-8140   °[place] [facility] (336) 342-1394 or (336) 342-3537 (After Hours)    ° ° °

## 2013-03-13 NOTE — ED Notes (Signed)
Patient states started having a sore throat around Thanksgiving and "ive been trying to doctor on it at home".   Patient states he now has generalized body aches associated with same.

## 2013-03-13 NOTE — ED Provider Notes (Signed)
CSN: [id number]     Arrival date & time 03/13/13  1146 History  This chart was scribed for non-physician practitioner Hyman Bible, PA-C working with Kathalene Frames, MD by Zettie Pho, ED Scribe. This patient was seen in room TR08C/TR08C and the patient's care was started at 1:21 PM.    Chief Complaint  Patient presents with  . Sore Throat  . Generalized Body Aches   The history is provided by the patient. No language interpreter was used.   HPI Comments: Jon Holmes is a 53 y.o. male who presents to the Emergency Department complaining of a constant sore throat that he states has been ongoing for the past 2 months. He states the pain is exacerbated with swallowing and is worse in the morning after sleeping. He denies difficulty swallowing or stiffness/swelling of the neck.  Patient reports a subjective fever (patient is afebrile at 98 in the ED), diffuse myalgias and arthralgias, fatigue, and left ear pain. Patient also reports a small rash to the left arm and left side of his chest/abdomen onset 3 days ago and denies any itching or pain to the area. He denies any recent changes in at-home products (i.e. Soaps, detergents, etc.). He denies any, congestion, rhinorrhea, cough, nausea, emesis.  He does describe occasional reflux type symptoms after eating.  He states he does not currently have a PCP. Patient has no other pertinent medical history.   Past Medical History  Diagnosis Date  . Allergy    Past Surgical History  Procedure Laterality Date  . 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 History  Substance Use Topics  . Smoking status: Never Smoker   . Smokeless tobacco: Never Used  . Alcohol Use: Yes    Review of Systems  A complete 10 system review of systems was obtained and all systems are negative except as noted in the HPI and PMH.   Allergies  Shellfish allergy  Home Medications   Current Outpatient Rx  Name  Route  Sig  Dispense   Refill  . ciprofloxacin (CIPRO) 500 MG tablet   Oral   Take 1 tablet (500 mg total) by mouth 2 (two) times daily.   20 tablet   0   . polyethylene glycol powder (GLYCOLAX/MIRALAX) powder   Oral   Take 17 g by mouth daily.   3350 g   1    Triage Vitals: BP 133/87  Pulse 75  Temp(Src) 98 F (36.7 C) (Oral)  Resp 18  Ht 5\' 8"  (1.727 m)  Wt 177 lb (80.287 kg)  BMI 26.92 kg/m2  SpO2 99%  Physical Exam  Nursing note and vitals reviewed. Constitutional: He appears well-developed and well-nourished.  HENT:  Head: Normocephalic and atraumatic.  Right Ear: Hearing, external ear and ear canal normal.  Left Ear: Hearing, external ear and ear canal normal.  Mouth/Throat: Uvula is midline and oropharynx is clear and moist. No trismus in the jaw. No oropharyngeal exudate, posterior oropharyngeal edema or posterior oropharyngeal erythema.  Cerumen impaction in the bilateral ears. No tonsillar edema or exudate.   Eyes: EOM are normal. Pupils are equal, round, and reactive to light.  Neck: Normal range of motion. Neck supple.  Cardiovascular: Normal rate, regular rhythm and normal heart sounds.   Pulmonary/Chest: Effort normal and breath sounds normal. He has no wheezes.  Musculoskeletal: Normal range of motion.  Lymphadenopathy:    He has no cervical adenopathy.  Neurological:  He is alert.  Skin: Skin is warm and dry. Rash noted.  Diffuse, erythematous, papular rash located on the left arm and the entire left side of the chest and abdomen.   Psychiatric: He has a normal mood and affect. His behavior is normal.    ED Course  Procedures (including critical care time)  DIAGNOSTIC STUDIES: Oxygen Saturation is 99% on room air, normal by my interpretation.    COORDINATION OF CARE: 1:28 PM- Discussed that there are no signs of strep throat or infection at this time and that symptoms may be due to acid reflux. Will discharge patient with reflux medication. Will discharge patient with  hydrocortisone cream to apply to the rashed areas at home. Advised patient to follow up with the referred PCP if symptoms do not improve in about a week. Discussed treatment plan with patient at bedside and patient verbalized agreement.     Labs Review Labs Reviewed - No data to display Imaging Review No results found.  EKG Interpretation   None       MDM  No diagnosis found. Patient presenting with a chief complaint of sore throat for the past 2 months.  Patient also describing GERD like symptoms.  Feel that the sore throat is most likely secondary to GERD.  Patient instructed to take Omeprazole.  Patient is afebrile.  No difficulty swallowing.  Feel that the patient is stable for discharge.  Instructed patient to follow up with PCP.  Return precautions given.    Hyman Bible, PA-C 03/14/13 1212

## 2013-03-19 NOTE — ED Provider Notes (Signed)
Medical screening examination/treatment/procedure(s) were performed by non-physician practitioner and as supervising physician I was immediately available for consultation/collaboration.    Kathalene Frames, MD 03/19/13 [phone]

## 2013-03-25 ENCOUNTER — Ambulatory Visit (INDEPENDENT_AMBULATORY_CARE_PROVIDER_SITE_OTHER): Payer: PRIVATE HEALTH INSURANCE | Admitting: Internal Medicine

## 2013-03-25 VITALS — BP 114/84 | HR 89 | Temp 98.7°F | Resp 16 | Ht 68.5 in | Wt 166.2 lb

## 2013-03-25 DIAGNOSIS — M6281 Muscle weakness (generalized): Secondary | ICD-10-CM

## 2013-03-25 DIAGNOSIS — Z0000 Encounter for general adult medical examination without abnormal findings: Secondary | ICD-10-CM

## 2013-03-25 DIAGNOSIS — R21 Rash and other nonspecific skin eruption: Secondary | ICD-10-CM

## 2013-03-25 DIAGNOSIS — J029 Acute pharyngitis, unspecified: Secondary | ICD-10-CM

## 2013-03-25 DIAGNOSIS — R5381 Other malaise: Secondary | ICD-10-CM

## 2013-03-25 DIAGNOSIS — R195 Other fecal abnormalities: Secondary | ICD-10-CM

## 2013-03-25 DIAGNOSIS — R5383 Other fatigue: Secondary | ICD-10-CM

## 2013-03-25 DIAGNOSIS — L710 Perioral dermatitis: Secondary | ICD-10-CM

## 2013-03-25 LAB — POCT URINALYSIS DIPSTICK
Blood, UA: NEGATIVE
GLUCOSE UA: NEGATIVE
KETONES UA: NEGATIVE
LEUKOCYTES UA: NEGATIVE
Nitrite, UA: NEGATIVE
Protein, UA: 100
Spec Grav, UA: >=1.03
Urobilinogen, UA: 2
pH, UA: 5.5

## 2013-03-25 LAB — COMPREHENSIVE METABOLIC PANEL
ALK PHOS: 152 U/L — AB (ref 39–117)
ALT: 34 U/L (ref 0–53)
AST: 28 U/L (ref 0–37)
Albumin: 3.9 g/dL (ref 3.5–5.2)
BUN: 8 mg/dL (ref 6–23)
CALCIUM: 9.2 mg/dL (ref 8.4–10.5)
CO2: 26 mEq/L (ref 19–32)
CREATININE: 0.98 mg/dL (ref 0.50–1.35)
Chloride: 101 mEq/L (ref 96–112)
Glucose, Bld: 88 mg/dL (ref 70–99)
Potassium: 4.7 mEq/L (ref 3.5–5.3)
Sodium: 134 mEq/L — ABNORMAL LOW (ref 135–145)
TOTAL PROTEIN: 8 g/dL (ref 6.0–8.3)
Total Bilirubin: 0.6 mg/dL (ref 0.2–1.2)

## 2013-03-25 LAB — POCT CBC
Granulocyte percent: 69.7 %G (ref 37–80)
HCT, POC: 47.1 % (ref 43.5–53.7)
Hemoglobin: 14.9 g/dL (ref 14.1–18.1)
Lymph, poc: 1.6 (ref 0.6–3.4)
MCH, POC: 26.4 pg — AB (ref 27–31.2)
MCHC: 31.6 g/dL — AB (ref 31.8–35.4)
MCV: 83.4 fL (ref 80–97)
MID (cbc): 0.8 (ref 0–0.9)
MPV: 8.7 fL (ref 0–99.8)
PLATELET COUNT, POC: 513 10*3/uL — AB (ref 142–424)
POC Granulocyte: 5.5 (ref 2–6.9)
POC LYMPH %: 20.8 % (ref 10–50)
POC MID %: 9.5 %M (ref 0–12)
RBC: 5.65 M/uL (ref 4.69–6.13)
RDW, POC: 15.1 %
WBC: 7.9 10*3/uL (ref 4.6–10.2)

## 2013-03-25 LAB — LIPID PANEL
CHOL/HDL RATIO: 6.2 ratio
Cholesterol: 192 mg/dL (ref 0–200)
HDL: 31 mg/dL — ABNORMAL LOW (ref >39)
LDL Cholesterol: 135 mg/dL — ABNORMAL HIGH (ref 0–99)
TRIGLYCERIDES: 128 mg/dL (ref <150)
VLDL: 26 mg/dL (ref 0–40)

## 2013-03-25 LAB — POCT UA - MICROSCOPIC ONLY
BACTERIA, U MICROSCOPIC: NEGATIVE
Casts, Ur, LPF, POC: NEGATIVE
Yeast, UA: NEGATIVE

## 2013-03-25 LAB — POCT RAPID STREP A (OFFICE): RAPID STREP A SCREEN: NEGATIVE

## 2013-03-25 LAB — IFOBT (OCCULT BLOOD): IMMUNOLOGICAL FECAL OCCULT BLOOD TEST: POSITIVE

## 2013-03-25 LAB — CK: CK TOTAL: 118 U/L (ref 7–232)

## 2013-03-25 LAB — POCT SEDIMENTATION RATE: POCT SED RATE: 88 mm/h — AB (ref 0–22)

## 2013-03-25 MED ORDER — TRIAMCINOLONE 0.1 % CREAM:EUCERIN CREAM 1:1: 1.0000 "application " | TOPICAL_CREAM | Freq: Two times a day (BID) | CUTANEOUS | Status: DC

## 2013-03-25 MED ORDER — VALACYCLOVIR HCL 1 G PO TABS: 1000.0000 mg | ORAL_TABLET | Freq: Two times a day (BID) | ORAL | Status: DC

## 2013-03-25 MED ORDER — DOXYCYCLINE HYCLATE 100 MG PO TABS: 100.0000 mg | ORAL_TABLET | Freq: Two times a day (BID) | ORAL | Status: DC

## 2013-03-25 NOTE — Progress Notes (Signed)
Subjective:   This chart was scribed for Jon Lin, MD by Forrestine Him, [facility] Scribe. This patient was seen in room 4 and the patient's care was started 4:21 PM.    Patient ID: Jon Holmes, male    DOB: [birth year], 53 y.o.   MRN: [id number]  HPI  HPI Comments: Jon Holmes is a 53 y.o. male who [facility] to [facility] seeking a complete physical examination today. Pt states he was prompted to come in due to being over due for a CPE.  He reports weight change in the last few months, as he states he has lost some weight recently.  Pt states he works 3rd shift and sleeps from 9 AM to 4 PM. Pt reports fatigue onset 2 months, but associated this symptom with his work schedule. He also mentioned he thought he potentially had the Flu in November 2014 and also associates this with his current fatigue. He denies any potential to fall asleep at work secondary to working late hours.  Pt reports a sore throat that initially started 1 month ago. Pt was seen in the ED a few days ago and was told it was related to acid reflux. He states the prescribed omeprazole has not provided him with any improvement. He states the pain in his throat is exacerbated by eating.   He says he has also noted a non itching rash to left arm and lower extremities bilaterally onset 2 weeks. Pt states he associates the rash with his sore throat as they both started and appeared around the same time. He reports a rash with associated swollen around the mouth that he states appeared after his barber shaved him onset a few days.  Pt reports mild lower back pain he potentially associates with laying down for long periods of time. He denies any pain with twisting and bending for this pain.  He has noted pain to his elbow joint bilaterally he describes as "weakness". He states he has noted a decrease in his strength over the course of a couple years. Denies any issues with sensation to  his arms.  He reports intermittent constipation and diarrhea, and denies ever having a colonoscopy. Pt states he was given prescribed medication to help with these symptoms, but does not reports any significant improvement.  He admits to frequent urination consisting of getting up to use the restroom in the middle of the night 1-2 times, and mild testicular pain. He reports a new rash to the testicles onset 2 days. He denies any recent sexual contact, but states he participated in oral sex about 6 months ago.  At this time he denies consistent cough, trouble breathing, dysuria, postnasal drip, congestion, SOB, or numbness.  Last tetanus shot: 10/03/2006  Review of Systems  Constitutional: Positive for fatigue and unexpected weight change. Negative for fever, chills and appetite change.  HENT: Positive for sore throat. Negative for congestion, postnasal drip, sinus pressure and trouble swallowing.   Eyes: Negative for visual disturbance.  Respiratory: Negative for cough and shortness of breath.   Gastrointestinal: Positive for abdominal pain, diarrhea and constipation. Negative for nausea and vomiting.  Genitourinary: Positive for testicular pain.  Musculoskeletal: Positive for myalgias.  Neurological: Positive for weakness. Negative for numbness.  Psychiatric/Behavioral: Negative for confusion. The patient is not nervous/anxious.   All other systems reviewed and are negative.    Past Medical History  Diagnosis Date  . Allergy     Past Surgical History  Procedure Laterality Date  . 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 Marital Status: Single    Spouse Name: N/A    Number of Children: N/A  . Years of Education: N/A   Occupational History  . Not on file.   Social History Main Topics  . Smoking status: Never Smoker   . Smokeless tobacco: Never Used  . Alcohol Use: Yes  . Drug Use: No  . Sexual Activity: Not on file   Other Topics  Concern  . Not on file   Social History Narrative  . No narrative on file   Triage Vitals: BP 114/84  Pulse 89  Temp(Src) 98.7 F (37.1 C) (Oral)  Resp 16  Ht 5' 8.5" (1.74 m)  Wt 166 lb 3.2 oz (75.388 kg)  BMI 24.90 kg/m2  SpO2 98%   Objective:   Physical Exam  Nursing note and vitals reviewed. Constitutional: He is oriented to person, place, and time. He appears well-developed and well-nourished. No distress.  HENT:  Head: Normocephalic and atraumatic.  Right Ear: External ear normal.  Left Ear: External ear normal.  Nose: Nose normal.  Mouth/Throat: Posterior oropharyngeal erythema present. No oropharyngeal exudate.  Eyes: Conjunctivae and EOM are normal. Pupils are equal, round, and reactive to light.  Neck: Normal range of motion. Neck supple. No thyromegaly present.  Cardiovascular: Normal rate, regular rhythm, normal heart sounds and intact distal pulses.   No murmur heard. Pulmonary/Chest: Effort normal and breath sounds normal.  Abdominal: Soft. Bowel sounds are normal. He exhibits no distension and no mass. There is no tenderness. There is no rebound and no guarding.  Genitourinary: Prostate normal and penis normal. Guaiac positive stool.  See skin  Musculoskeletal: Normal range of motion. He exhibits no edema and no tenderness.  Lymphadenopathy:    He has no cervical adenopathy.  No lymph node tenderness  Neurological: He is alert and oriented to person, place, and time. No cranial nerve deficit.  Skin: Skin is warm and dry.  Maculopapular rash that is hyperpigmented with numerous small lesions sparing the palms to extremites Papular rash small in pinpoint that is some what erythematous to the back Scaly confluent rash similar to rash on legs which exhibits dryness to lower back and legs Beard pattern around facial hair that exhibits an erythematous papular rash that appears to be very irritated Tender vesicular rash all over testicles bilalateraly including 1  lesion at the [facility]   Psychiatric: He has a normal mood and affect. His behavior is normal. Judgment and thought content normal.    Assessment & Plan:

## 2013-03-26 ENCOUNTER — Telehealth: Payer: Self-pay | Admitting: Family Medicine

## 2013-03-26 LAB — PSA: PSA: 1.73 ng/mL (ref <=4.00)

## 2013-03-26 LAB — RPR: RPR Ser Ql: REACTIVE — AB

## 2013-03-26 LAB — T.PALLIDUM AB, TOTAL: T pallidum Antibodies (TP-PA): 1.72 S/CO — ABNORMAL HIGH (ref <0.90)

## 2013-03-26 LAB — HIV ANTIBODY (ROUTINE TESTING W REFLEX): HIV: NONREACTIVE

## 2013-03-26 LAB — RPR TITER

## 2013-03-26 LAB — TSH: TSH: 1.227 u[IU]/mL (ref 0.350–4.500)

## 2013-03-26 NOTE — Telephone Encounter (Signed)
Patient notified of lab results and will come to office tomorrow for follow-up

## 2013-03-27 ENCOUNTER — Encounter: Payer: Self-pay | Admitting: Internal Medicine

## 2013-03-27 ENCOUNTER — Ambulatory Visit (INDEPENDENT_AMBULATORY_CARE_PROVIDER_SITE_OTHER): Payer: PRIVATE HEALTH INSURANCE | Admitting: Internal Medicine

## 2013-03-27 VITALS — BP 134/80 | HR 78 | Temp 98.7°F | Resp 16 | Ht 68.0 in | Wt 165.0 lb

## 2013-03-27 DIAGNOSIS — A5274 Syphilis of liver and other viscera: Secondary | ICD-10-CM

## 2013-03-27 DIAGNOSIS — A539 Syphilis, unspecified: Secondary | ICD-10-CM | POA: Insufficient documentation

## 2013-03-27 LAB — CULTURE, GROUP A STREP: Organism ID, Bacteria: NORMAL

## 2013-03-27 LAB — HERPES SIMPLEX VIRUS CULTURE
Organism ID, Bacteria: NOT DETECTED
Organism ID, Bacteria: NOT DETECTED

## 2013-03-27 MED ORDER — PENICILLIN G BENZATHINE 1200000 UNIT/2ML IM SUSP
2.4000 10*6.[IU] | Freq: Once | INTRAMUSCULAR | Status: AC
  Administered 2013-03-27: 2.4 10*6.[IU] via INTRAMUSCULAR

## 2013-03-27 NOTE — Progress Notes (Signed)
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

## 2013-03-28 LAB — HEPATITIS PANEL, ACUTE
HCV Ab: NEGATIVE
HEP B S AG: NEGATIVE
Hep A IgM: NONREACTIVE
Hep B C IgM: NONREACTIVE

## 2013-04-02 ENCOUNTER — Encounter: Payer: Self-pay | Admitting: Gastroenterology

## 2013-04-03 ENCOUNTER — Other Ambulatory Visit: Payer: Self-pay | Admitting: Internal Medicine

## 2013-04-04 ENCOUNTER — Encounter (INDEPENDENT_AMBULATORY_CARE_PROVIDER_SITE_OTHER): Payer: BC Managed Care – PPO | Admitting: General Surgery

## 2013-04-10 ENCOUNTER — Encounter (INDEPENDENT_AMBULATORY_CARE_PROVIDER_SITE_OTHER): Payer: Self-pay | Admitting: General Surgery

## 2013-05-03 ENCOUNTER — Ambulatory Visit: Payer: BC Managed Care – PPO | Admitting: Gastroenterology

## 2013-05-29 ENCOUNTER — Ambulatory Visit (INDEPENDENT_AMBULATORY_CARE_PROVIDER_SITE_OTHER): Payer: PRIVATE HEALTH INSURANCE | Admitting: Internal Medicine

## 2013-05-29 VITALS — BP 132/83 | HR 67 | Temp 98.1°F | Resp 16 | Ht 68.0 in | Wt 174.0 lb

## 2013-05-29 DIAGNOSIS — A539 Syphilis, unspecified: Secondary | ICD-10-CM

## 2013-05-29 DIAGNOSIS — A5274 Syphilis of liver and other viscera: Secondary | ICD-10-CM

## 2013-05-29 NOTE — Progress Notes (Addendum)
   Subjective:    Patient ID: Jon Holmes, male    DOB: 05/06/1960, 53 y.o.   MRN: [id number]  HPI This chart was scribed for [facility], by Lovena Le Day, Scribe. This patient was seen in room 25 and the patient's care was started at 4:52 PM.  HPI Comments: Jon Holmes is a 53 y.o. male who presents to the [facility] for follow up and neck pain.  He reports that his skin has healed completely since his last ov.  He states last week began with some superficial right lateral neck pain, denies any acute injuries or muscle strains. He denies any strange taste in his mouth, sore throat, rhinorrhea or jaw pain.   He reports that he has been feeling well and having no recent illnesses.    Patient Active Problem List   Diagnosis Date Noted  . 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 HYPERTROPHY PROSTATE W/UR OBST & OTH LUTS 04/03/2009  . RASH AND OTHER NONSPECIFIC SKIN ERUPTION 04/03/2009   meds none   Review of Systems  Constitutional: Negative for fever and chills.  Respiratory: Negative for cough and shortness of breath.   Cardiovascular: Negative for chest pain.  Gastrointestinal: Negative for abdominal pain.  Musculoskeletal: Negative for back pain.      Objective:   Physical Exam  Nursing note and vitals reviewed. Constitutional: He is oriented to person, place, and time. He appears well-developed and well-nourished. No distress.  HENT:  Head: Normocephalic and atraumatic.  Right Ear: External ear normal.  Left Ear: External ear normal.  Mouth/Throat: Oropharynx is clear and moist. No oropharyngeal exudate.  Eyes: Conjunctivae are normal. Pupils are equal, round, and reactive to light. Right eye exhibits no discharge. Left eye exhibits no discharge.  Neck: Normal range of motion. No thyromegaly present.  Small anterior cervical lymph node 1 cm right neck which is  slightly tender to palpation. No evidence for dental disease, abscess or gum abnormalities. Tms and scalp clear. No acne. Salivary glands not enlarged. Throat is clear No thyroid nodules No other adenopathy  Cardiovascular: Normal rate.   Pulmonary/Chest: Effort normal.  Lymphadenopathy:    He has cervical adenopathy.  Neurological: He is alert and oriented to person, place, and time.  Skin: Skin is warm and dry. No rash noted. No erythema.  Psychiatric: He has a normal mood and affect. His behavior is normal. Judgment and thought content normal.   Triage Vitals: BP 132/83  Pulse 67  Temp(Src) 98.1 F (36.7 C)  Resp 16  Ht 5\' 8"  (1.727 m)  Wt 174 lb (78.926 kg)  BMI 26.46 kg/m2  SpO2 97%     Assessment & Plan:  DIAGNOSTIC STUDIES: Oxygen Saturation is 97% on room air, normal by my interpretation.     Syphilis-secondary - Plan: RPR, HIV antibody, CBC with Differential  Syphilis of liver - Plan: Comprehensive metabolic panel  Adenopathy  followup labs done rtc if node persists or increases COORDINATION OF CARE: At 450 PM Discussed treatment plan with patient which includes blood work. Patient agrees.  I have completed the patient encounter in its entirety as documented by the scribe, with editing by me where necessary. Robert P. Laney Pastor, M.D.

## 2013-05-30 LAB — CBC WITH DIFFERENTIAL/PLATELET
Basophils Absolute: 0 10*3/uL (ref 0.0–0.1)
Basophils Relative: 1 % (ref 0–1)
Eosinophils Absolute: 0.2 10*3/uL (ref 0.0–0.7)
Eosinophils Relative: 4 % (ref 0–5)
HCT: 43.9 % (ref 39.0–52.0)
HEMOGLOBIN: 15 g/dL (ref 13.0–17.0)
LYMPHS ABS: 1.7 10*3/uL (ref 0.7–4.0)
Lymphocytes Relative: 36 % (ref 12–46)
MCH: 26.2 pg (ref 26.0–34.0)
MCHC: 34.2 g/dL (ref 30.0–36.0)
MCV: 76.6 fL — ABNORMAL LOW (ref 78.0–100.0)
MONOS PCT: 15 % — AB (ref 3–12)
Monocytes Absolute: 0.7 10*3/uL (ref 0.1–1.0)
NEUTROS ABS: 2 10*3/uL (ref 1.7–7.7)
NEUTROS PCT: 44 % (ref 43–77)
PLATELETS: 317 10*3/uL (ref 150–400)
RBC: 5.73 MIL/uL (ref 4.22–5.81)
RDW: 16.1 % — ABNORMAL HIGH (ref 11.5–15.5)
WBC: 4.6 10*3/uL (ref 4.0–10.5)

## 2013-05-30 LAB — COMPREHENSIVE METABOLIC PANEL
ALK PHOS: 92 U/L (ref 39–117)
ALT: 20 U/L (ref 0–53)
AST: 20 U/L (ref 0–37)
Albumin: 4.4 g/dL (ref 3.5–5.2)
BILIRUBIN TOTAL: 0.7 mg/dL (ref 0.2–1.2)
BUN: 9 mg/dL (ref 6–23)
CO2: 23 meq/L (ref 19–32)
Calcium: 9.3 mg/dL (ref 8.4–10.5)
Chloride: 104 mEq/L (ref 96–112)
Creat: 0.96 mg/dL (ref 0.50–1.35)
GLUCOSE: 88 mg/dL (ref 70–99)
POTASSIUM: 4.1 meq/L (ref 3.5–5.3)
SODIUM: 139 meq/L (ref 135–145)
Total Protein: 7.4 g/dL (ref 6.0–8.3)

## 2013-05-30 LAB — RPR TITER

## 2013-05-30 LAB — HIV ANTIBODY (ROUTINE TESTING W REFLEX): HIV: NONREACTIVE

## 2013-05-30 LAB — RPR: RPR Ser Ql: REACTIVE — AB

## 2013-05-31 LAB — T.PALLIDUM AB, TOTAL: T pallidum Antibodies (TP-PA): 3.01 S/CO — ABNORMAL HIGH (ref <0.90)

## 2013-06-06 ENCOUNTER — Telehealth: Payer: Self-pay | Admitting: Radiology

## 2013-06-06 NOTE — Telephone Encounter (Signed)
Pt calling about his lab results. Have you went over them?

## 2013-06-09 NOTE — Telephone Encounter (Signed)
>   91fold reduction=effective rx -hiv Planning marriage F/u 11/15 to retest

## 2013-08-21 IMAGING — CT CT MAXILLOFACIAL W/ CM
3 series · 16 of 47 positions shown, 19 images · IV contrast (omnipaque)
Comparison: None.

CLINICAL DATA: Allergic reaction to hair dye.  Facial swelling.

CT MAXILLOFACIAL WITH CONTRAST
TECHNIQUE: Multidetector CT imaging of the maxillofacial
structures was performed with intravenous contrast. Multiplanar CT
image reconstructions were also generated.
Contrast: 80mL OMNIPAQUE IOHEXOL 300 MG/ML IV SOLN

[Series 3: orbit/facial 2.0 h30s · axial · 0.37mm/px · z∈[-193,-43]mm · 10 of 88 slices shown, 13 images]
[im 7/88  brain]
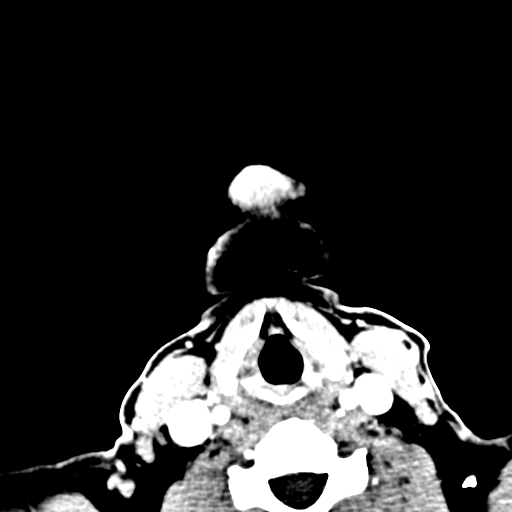
[im 7/88  bone]
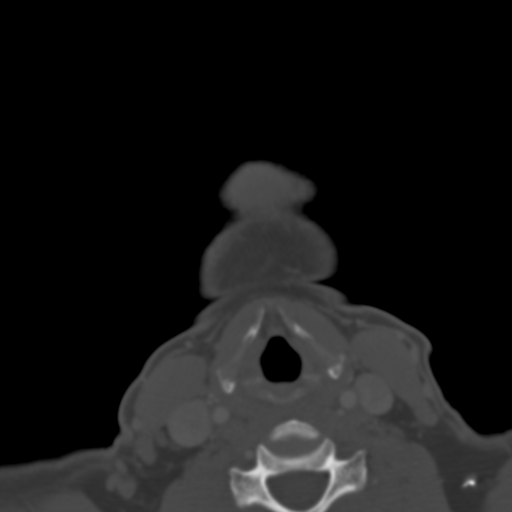
[im 16/88  bone]
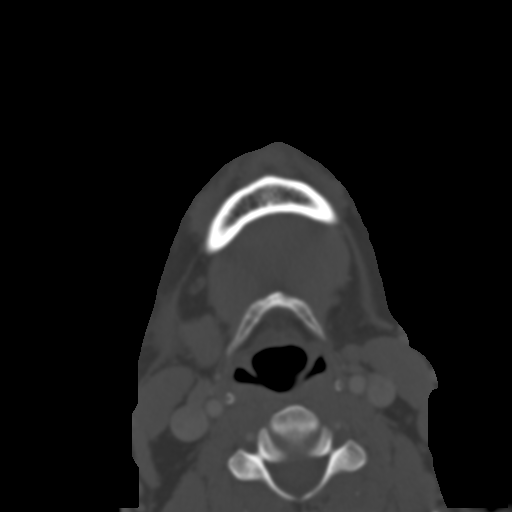
[im 25/88  bone]
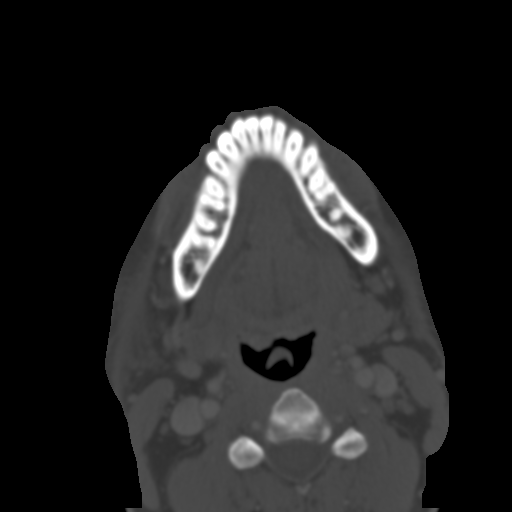
[im 31/88  bone]
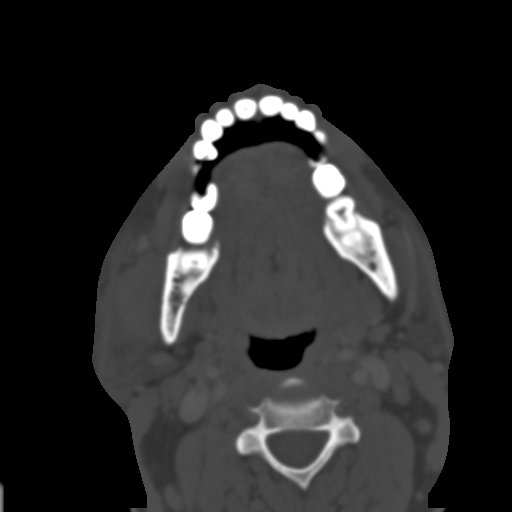
[im 40/88  brain]
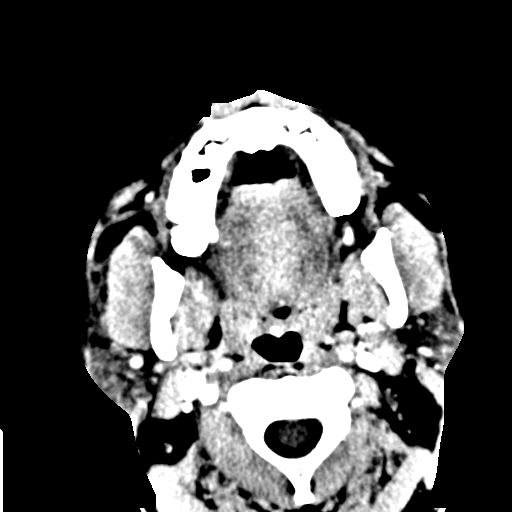
[im 40/88  bone]
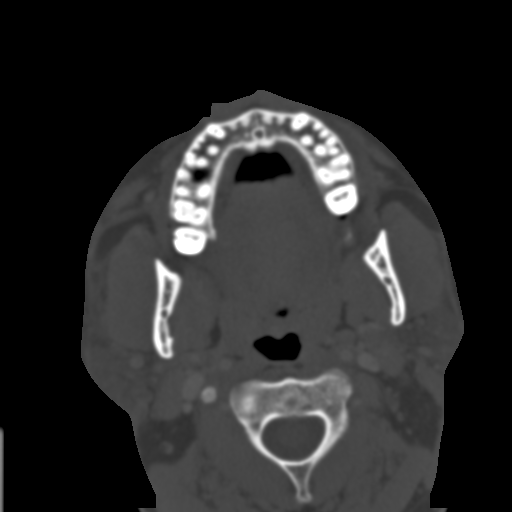
[im 49/88  bone]
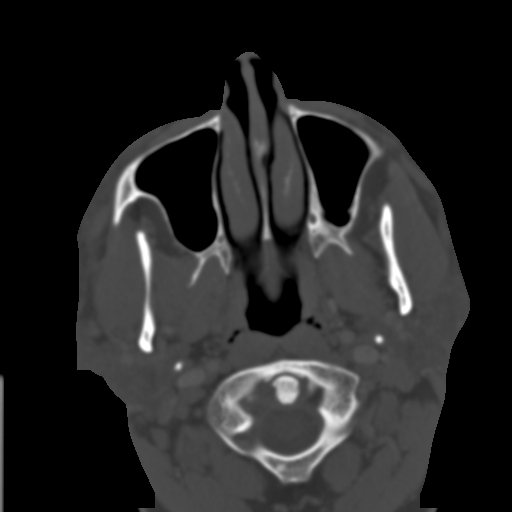
[im 58/88  bone]
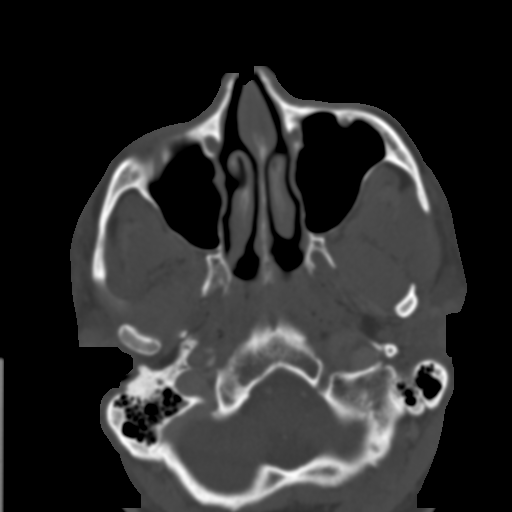
[im 67/88  bone]
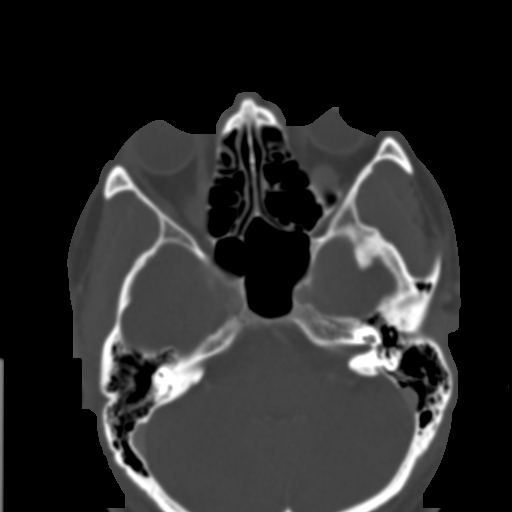
[im 73/88  brain]
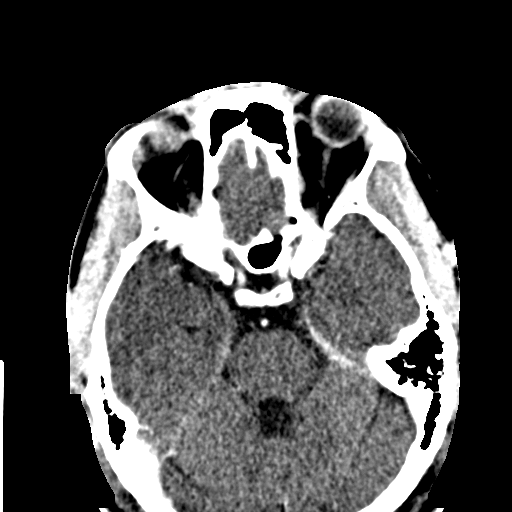
[im 73/88  bone]
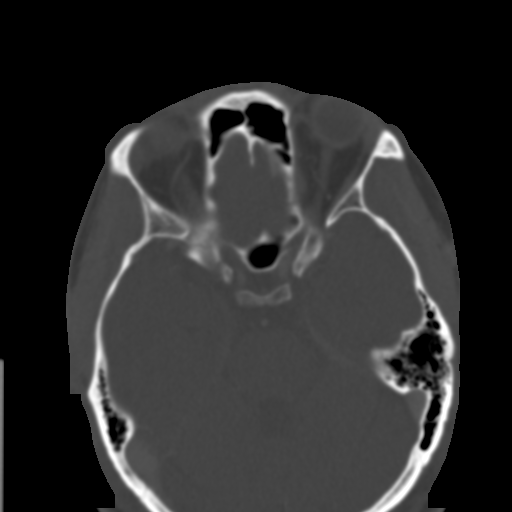
[im 82/88  bone]
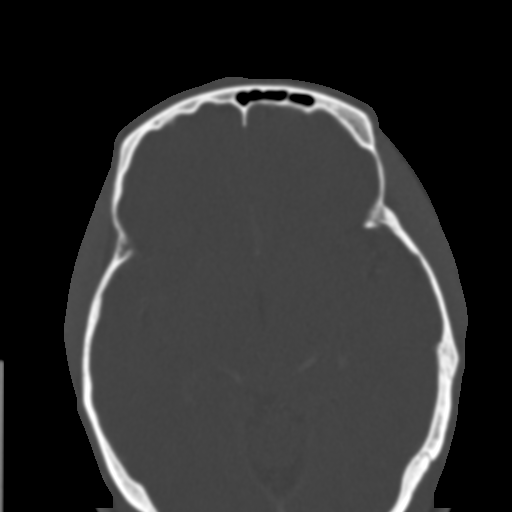

[Series 602: st cor · coronal · 0.37mm/px · 3 of 83 slices shown]
[im 28/83  bone]
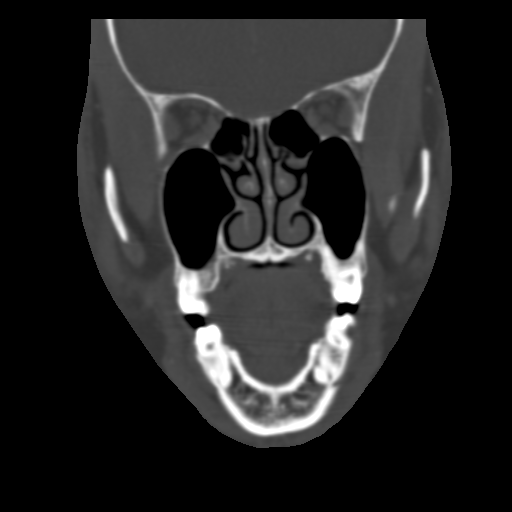
[im 37/83  bone]
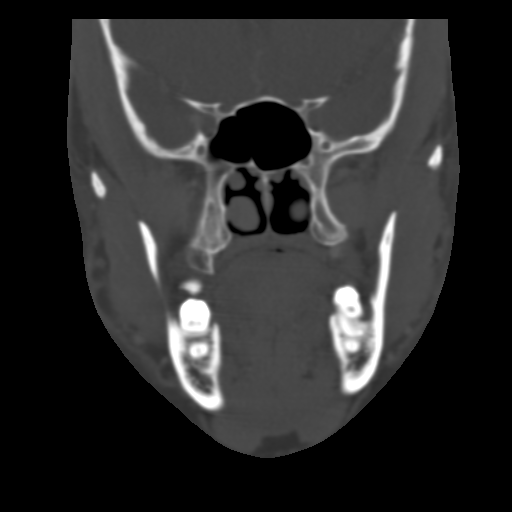
[im 46/83  bone]
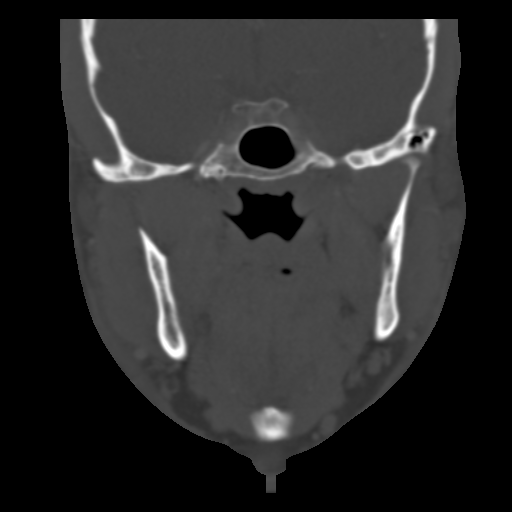

[Series 603: st sag · sagittal · 0.37mm/px · 3 of 83 slices shown]
[im 28/83  bone]
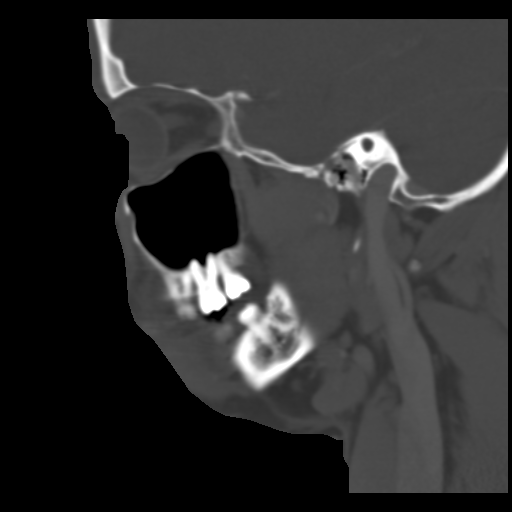
[im 42/83  bone]
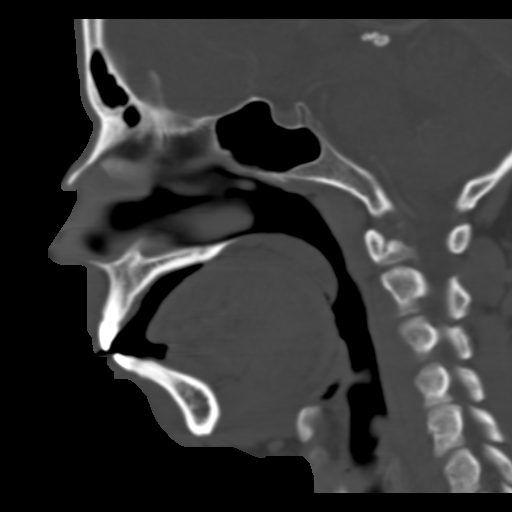
[im 55/83  bone]
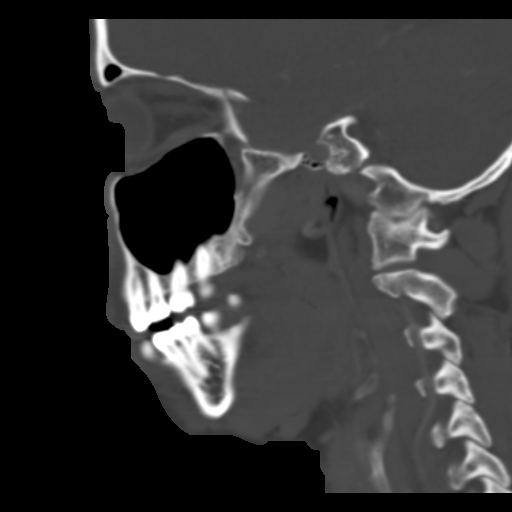

[16 of 47 positions shown; findings below may reference images not displayed]

FINDINGS: There is symmetric, diffuse, mild subcutaneous edema.
This appears nonfocal.  No evidence for subcutaneous or deep
abscess.  No soft tissue gas.  No radiopaque foreign body.
Submandibular glands, parotid glands have a normal appearance.  No
enlarged lymph nodes identified.  The airway is unremarkable in
appearance.  Parapharyngeal space is normal.  The paranasal sinuses
are well-aerated.  The mastoid air cells are well-aerated. The
orbits have a normal appearance.

Note is made of a peri apical abscess involving the left third
mandibular molar.
IMPRESSION: 1.  Diffuse, mild subcutaneous edema.  No significant adenopathy,
mass.
2. Note is made of peri apical abscess involving the left third
mandibular molar.

## 2016-06-16 ENCOUNTER — Encounter (HOSPITAL_COMMUNITY): Payer: Self-pay | Admitting: *Deleted

## 2016-06-16 ENCOUNTER — Emergency Department (HOSPITAL_COMMUNITY)
Admission: EM | Admit: 2016-06-16 | Discharge: 2016-06-16 | Disposition: A | Discharge status: Home / Self Care | Payer: No Typology Code available for payment source | Attending: Emergency Medicine | Admitting: Emergency Medicine

## 2016-06-16 DIAGNOSIS — Y999 Unspecified external cause status: Secondary | ICD-10-CM | POA: Diagnosis not present

## 2016-06-16 DIAGNOSIS — Y9301 Activity, walking, marching and hiking: Secondary | ICD-10-CM | POA: Insufficient documentation

## 2016-06-16 DIAGNOSIS — S0501XA Injury of conjunctiva and corneal abrasion without foreign body, right eye, initial encounter: Secondary | ICD-10-CM | POA: Insufficient documentation

## 2016-06-16 DIAGNOSIS — X58XXXA Exposure to other specified factors, initial encounter: Secondary | ICD-10-CM | POA: Diagnosis not present

## 2016-06-16 DIAGNOSIS — Y9263 Factory as the place of occurrence of the external cause: Secondary | ICD-10-CM | POA: Diagnosis not present

## 2016-06-16 DIAGNOSIS — S0591XA Unspecified injury of right eye and orbit, initial encounter: Secondary | ICD-10-CM | POA: Diagnosis present

## 2016-06-16 MED ORDER — FLUORESCEIN SODIUM 0.6 MG OP STRP
1.0000 | ORAL_STRIP | Freq: Once | OPHTHALMIC | Status: AC
Start: 2016-06-16 — End: 2016-06-16
  Administered 2016-06-16: 1 via OPHTHALMIC
  Filled 2016-06-16: qty 1

## 2016-06-16 MED ORDER — ERYTHROMYCIN 5 MG/GM OP OINT: 1.0000 "application " | TOPICAL_OINTMENT | Freq: Once | OPHTHALMIC | Status: DC

## 2016-06-16 MED ORDER — TETRACAINE HCL 0.5 % OP SOLN
1.0000 [drp] | Freq: Once | OPHTHALMIC | Status: AC
  Administered 2016-06-16: 1 [drp] via OPHTHALMIC
  Filled 2016-06-16: qty 2

## 2016-06-16 MED ORDER — ERYTHROMYCIN 5 MG/GM OP OINT: TOPICAL_OINTMENT | OPHTHALMIC | 0 refills | Status: DC

## 2016-06-16 MED ORDER — PROPARACAINE HCL 0.5 % OP SOLN: 1.0000 [drp] | Freq: Once | OPHTHALMIC | Status: DC

## 2016-06-16 NOTE — ED Notes (Signed)
Pt is in stable condition upon d/c and ambulates from ED. 

## 2016-06-16 NOTE — ED Provider Notes (Signed)
[facility] DEPT Provider Note   CSN: [id number] Arrival date & time: 06/16/16  9678   By signing my name below, I, Charolotte Eke, attest that this documentation has been prepared under the direction and in the presence of Shawn Joy, PA-C. Electronically Signed: Charolotte Eke, Scribe. 06/16/16. 10:40 AM.    History   Chief Complaint Chief Complaint  Patient presents with  . Foreign Body in [place] is a 56 y.o. male with h/o allergies who presents to the Emergency Department complaining of 8/10 severity, sharp right eye pain that began yesterday. 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 Denies previous injuries or surgeries to the side. Patient wears glasses and does not wear contact lenses. He denies visual deficits, discharge from the eye, photophobia, or any other complaints.   The history is provided by the patient. No language interpreter was used.    Past Medical History:  Diagnosis Date  . Allergy     Patient Active Problem List   Diagnosis Date Noted  . 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 HYPERTROPHY PROSTATE W/UR OBST & OTH LUTS 04/03/2009  . RASH AND OTHER NONSPECIFIC SKIN ERUPTION 04/03/2009    Past Surgical History:  Procedure Laterality Date  . 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 Authorizing Provider  erythromycin ophthalmic ointment Place a 1/2 inch ribbon of ointment into the lower eyelid. 06/16/16   Lorayne Bender, PA-C    Family History No family history on file.  Social History Social History  Substance Use Topics  . Smoking status: Never Smoker  . Smokeless tobacco: Never Used  . Alcohol use Yes     Allergies   Shellfish allergy   Review of Systems Review of  Systems  HENT: Negative for facial swelling.   Eyes: Positive for pain and redness. Negative for photophobia, discharge and visual disturbance.  Neurological: Negative for headaches.     Physical Exam Updated Vital Signs BP (!) 139/92 (BP Location: Left Arm)   Pulse (!) 54   Temp 97.6 F (36.4 C) (Oral)   Resp 16   Ht 5\' 8"  (1.727 m)   Wt 189 lb (85.7 kg)   SpO2 99%   BMI 28.74 kg/m   Physical Exam  Constitutional: He appears well-developed and well-nourished. No distress.  HENT:  Head: Normocephalic and atraumatic.  Eyes: EOM are normal. Pupils are equal, round, and reactive to light.  Scleral injection on the right. Upper and lower lids appear to be normal. No contact lenses in place.  Woods Lamp exam shows increased uptake of fluorescein at the 9:00 position of the right cornea. Slit lamp exam was also performed with further evidence of corneal abrasion. No noted signs of corneal ulcer, iritis, anterior chamber damage, foreign bodies, or globe damage.  Tono-Pen values: Right eye: 15  Left eye: 16    Visual Acuity  Right Eye Distance: 20/40 without corrective lens Left Eye Distance: 20/30 without corrective lens Bilateral Distance: 20/30 without corrective lens  Right Eye Near:   Left Eye Near:    Bilateral Near:     Neck: Neck supple.  Cardiovascular: Normal rate and regular rhythm.   Pulmonary/Chest: Effort normal.  Neurological: He is alert.  Skin: Skin is warm and dry. He is not diaphoretic.  Psychiatric: He has a normal mood and affect. His behavior is normal.  Nursing note and vitals reviewed.    ED Treatments / Results   DIAGNOSTIC STUDIES: Oxygen Saturation is 99% on room air, normal by my interpretation.    COORDINATION OF CARE: 10:38 AM Discussed treatment plan with pt at bedside and pt agreed to plan.    Labs (all labs ordered are listed, but only abnormal results are displayed) Labs Reviewed - No data to display  EKG  EKG  Interpretation None       Radiology No results found.  Procedures Procedures (including critical care time)  Medications Ordered in ED Medications  fluorescein ophthalmic strip 1 strip (1 strip Right Eye Given by Other 06/16/16 0919)  tetracaine (PONTOCAINE) 0.5 % ophthalmic solution 1-2 drop (1 drop Right Eye Given by Other 06/16/16 0919)     Initial Impression / Assessment and Plan / ED Course  I have reviewed the triage vital signs and the nursing notes.  Pertinent labs & imaging results that were available during my care of the patient were reviewed by me and considered in my medical decision making (see chart for details).     Patient presents with right eye injury. Corneal abrasion noted on exam. No noted foreign bodies. Plan includes antibiotics and referral for follow up with an ophthalmologist if sensation/pain continues. Education including eye protection, rinsing of eyes before rubbing them should a similar situation occur in the future. The patient was given instructions for home care as well as return precautions. Patient voices understanding of these instructions, accepts the plan, and is comfortable with discharge.   Final Clinical Impressions(s) / ED Diagnoses   Final diagnoses:  Abrasion of right cornea, initial encounter    New Prescriptions Discharge Medication List as of 06/16/2016 10:51 AM    START taking these medications   Details  erythromycin ophthalmic ointment Place a 1/2 inch ribbon of ointment into the lower eyelid., Print       I personally performed the services described in this documentation, which was scribed in my presence. The recorded information has been reviewed and is accurate.    Lorayne Bender, PA-C 06/16/16 [place], MD 06/17/16 [id number]

## 2016-06-16 NOTE — ED Triage Notes (Signed)
States his right eye staring brothering him last pm red and painful today

## 2016-06-16 NOTE — Discharge Instructions (Signed)
There is evidence of a corneal abrasion in the right eye. Administer erythromycin ointment in the right eye 4 times a day for the next 5 days.  There was no noted foreign body, however, this does not mean that one does not exist. Should significant pain or foreign body sensation continue in the next 24 hours, please follow up with ophthalmology. Call the number provided to set up an appointment. Return to the ED should any symptoms worsen.

## 2017-02-24 ENCOUNTER — Ambulatory Visit (HOSPITAL_COMMUNITY)
Admission: EM | Admit: 2017-02-24 | Discharge: 2017-02-24 | Disposition: A | Discharge status: Home / Self Care | Payer: Self-pay | Attending: Emergency Medicine | Admitting: Emergency Medicine

## 2017-02-24 ENCOUNTER — Encounter (HOSPITAL_COMMUNITY): Payer: Self-pay

## 2017-02-24 DIAGNOSIS — I10 Essential (primary) hypertension: Secondary | ICD-10-CM

## 2017-02-24 MED ORDER — HYDROCHLOROTHIAZIDE 25 MG PO TABS: 25.0000 mg | ORAL_TABLET | Freq: Every day | ORAL | 1 refills | Status: DC

## 2017-02-24 NOTE — ED Triage Notes (Signed)
Pt said his bp was high for the past week and said he has a family hx but hasn't has this problem before. Said he was donating plasma last week his systolic was 729'M and earlier this week his systolic was ranging in the 140-160's. No other complaints

## 2017-02-24 NOTE — Discharge Instructions (Signed)
We discussed the risk of the bp meds  If symptoms become worse and you have blurred vision and signs of a stroke you will need to go to the Emergency room  Avoid salt in your diet  Find a pcp to follow up with care

## 2017-02-24 NOTE — ED Provider Notes (Signed)
[facility]    CSN: [id number] Arrival date & time: 02/24/17  1415     History   Chief Complaint Chief Complaint  Patient presents with  . Hypertension    HPI Jon Holmes is a 57 y.o. male.   Pt states that he was donating plasma a few days ago and bp was in hgh 200 and he has been monitoring the bp and it still stays around 160 or 170 mark does have some intemit dizziness from sitting to standing. No chest pain, no sob, no sx of cva, he was concerned due to his mother has htn. Would like to get a pcp.       Past Medical History:  Diagnosis Date  . Allergy     Patient Active Problem List   Diagnosis Date Noted  . 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 HYPERTROPHY PROSTATE W/UR OBST & OTH LUTS 04/03/2009  . RASH AND OTHER NONSPECIFIC SKIN ERUPTION 04/03/2009    Past Surgical History:  Procedure Laterality Date  . 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 Authorizing Provider  erythromycin ophthalmic ointment Place a 1/2 inch ribbon of ointment into the lower eyelid. 06/16/16   Joy, Shawn C, PA-C  hydrochlorothiazide (HYDRODIURIL) 25 MG tablet Take 1 tablet (25 mg total) by mouth daily. 02/24/17   Marney Setting, NP    Family History No family history on file.  Social History Social History   Tobacco Use  . Smoking status: Never Smoker  . Smokeless tobacco: Never Used  Substance Use Topics  . Alcohol use: Yes  . Drug use: No     Allergies   Shellfish allergy   Review of Systems Review of Systems  Eyes: Negative.   Respiratory: Negative.   Cardiovascular: Negative.   Gastrointestinal: Negative.   Musculoskeletal: Negative.   Neurological: Positive for dizziness.       Itnermit with stittng to standing      Physical Exam Triage Vital Signs ED Triage Vitals    Enc Vitals Group     BP 02/24/17 1527 (!) 161/95     Pulse Rate 02/24/17 1527 (!) 52     Resp 02/24/17 1527 18     Temp 02/24/17 1527 97.9 F (36.6 C)     Temp Source 02/24/17 1527 Oral     SpO2 02/24/17 1527 96 %     Weight --      Height --      Head Circumference --      Peak Flow --      Pain Score 02/24/17 1525 0     Pain Loc --      Pain Edu? --      Excl. in [place]? --    No data found.  Updated Vital Signs BP (!) 161/95 (BP Location: Left Arm)   Pulse (!) 52   Temp 97.9 F (36.6 C) (Oral)   Resp 18   SpO2 96%   Visual Acuity Right Eye Distance:   Left Eye Distance:   Bilateral Distance:    Right Eye Near:   Left Eye Near:    Bilateral Near:     Physical Exam  Constitutional: He appears well-developed.  Cardiovascular: Normal rate and regular rhythm.  Pulmonary/Chest: Effort normal and breath sounds normal.  Abdominal: Soft. Bowel sounds are normal.  Musculoskeletal: Normal range of motion.  Neurological: He is alert.  Skin: Capillary refill takes less than 2 seconds.     UC Treatments / Results  Labs (all labs ordered are listed, but only abnormal results are displayed) Labs Reviewed - No data to display  EKG  EKG Interpretation None       Radiology No results found.  Procedures Procedures (including critical care time)  Medications Ordered in UC Medications - No data to display   Initial Impression / Assessment and Plan / UC Course  I have reviewed the triage vital signs and the nursing notes.  Pertinent labs & imaging results that were available during my care of the patient were reviewed by me and considered in my medical decision making (see chart for details).     We discussed the risk of the bp meds  If symptoms become worse and you have blurred vision and signs of a stroke you will need to go to the Emergency room  Avoid salt in your diet  Find a pcp to follow up with care   Final Clinical Impressions(s) / UC Diagnoses    Final diagnoses:  Essential hypertension    ED Discharge Orders        Ordered    hydrochlorothiazide (HYDRODIURIL) 25 MG tablet  Daily     02/24/17 1552       Controlled Substance Prescriptions [place] Controlled Substance Registry consulted? Not Applicable   Marney Setting, NP 02/24/17 1601

## 2018-04-25 ENCOUNTER — Other Ambulatory Visit: Payer: Self-pay

## 2018-04-25 ENCOUNTER — Ambulatory Visit (HOSPITAL_COMMUNITY)
Admission: EM | Admit: 2018-04-25 | Discharge: 2018-04-25 | Disposition: A | Discharge status: Home / Self Care | Payer: Commercial Managed Care - PPO | Attending: Family Medicine | Admitting: Family Medicine

## 2018-04-25 ENCOUNTER — Encounter (HOSPITAL_COMMUNITY): Payer: Self-pay | Admitting: Emergency Medicine

## 2018-04-25 DIAGNOSIS — R05 Cough: Secondary | ICD-10-CM

## 2018-04-25 DIAGNOSIS — R0981 Nasal congestion: Secondary | ICD-10-CM | POA: Diagnosis not present

## 2018-04-25 DIAGNOSIS — R0982 Postnasal drip: Secondary | ICD-10-CM

## 2018-04-25 DIAGNOSIS — J029 Acute pharyngitis, unspecified: Secondary | ICD-10-CM | POA: Diagnosis not present

## 2018-04-25 DIAGNOSIS — J111 Influenza due to unidentified influenza virus with other respiratory manifestations: Secondary | ICD-10-CM

## 2018-04-25 DIAGNOSIS — R69 Illness, unspecified: Principal | ICD-10-CM

## 2018-04-25 MED ORDER — HYDROCODONE-HOMATROPINE 5-1.5 MG/5ML PO SYRP: 5.0000 mL | ORAL_SOLUTION | Freq: Four times a day (QID) | ORAL | 0 refills | Status: DC | PRN

## 2018-04-25 NOTE — ED Triage Notes (Signed)
Uri x 3 days.  General aches, headache.  Patient has a cough, feeling weak.

## 2018-04-25 NOTE — Discharge Instructions (Signed)

## 2018-05-01 NOTE — ED Provider Notes (Signed)
[facility]   [id number] 04/25/18 Arrival Time: 8563  ASSESSMENT & PLAN:  1. Influenza-like illness    See AVS for discharge instructions.  Meds ordered this encounter  Medications  . HYDROcodone-homatropine (HYCODAN) 5-1.5 MG/5ML syrup    Sig: Take 5 mLs by mouth every 6 (six) hours as needed for cough.    Dispense:  90 mL    Refill:  0   Cough medication sedation precautions. Discussed typical duration of symptoms. OTC symptom care as needed. Ensure adequate fluid intake and rest. May f/u with PCP or here as needed.  Reviewed expectations re: course of current medical issues. Questions answered. Outlined signs and symptoms indicating need for more acute intervention. Patient verbalized understanding. After Visit Summary given.   SUBJECTIVE: History from: patient.  Sheppard Luckenbach is a 58 y.o. male who presents with complaint of nasal congestion, post-nasal drainage, and a persistent dry cough; without sore throat. Onset abrupt, 3 d ago; with fatigue and with body aches. SOB: none. Wheezing: none. Fever: questions subjective. Overall normal PO intake without n/v. Known sick contacts: no. No specific or significant aggravating or alleviating factors reported. OTC treatment: various cough medications without relief.   Social History   Tobacco Use  Smoking Status Never Smoker  Smokeless Tobacco Never Used   ROS: As per HPI.   OBJECTIVE:  Vitals:   04/25/18 1826  BP: 129/84  Pulse: 75  Resp: 18  Temp: 97.8 F (36.6 C)  TempSrc: Temporal  SpO2: 100%     General appearance: alert; appears fatigued HEENT: nasal congestion; clear runny nose; throat irritation secondary to post-nasal drainage Neck: supple without LAD CV: RRR Lungs: unlabored respirations, symmetrical air entry without wheezing; cough: moderate Abd: soft Ext: no LE edema Skin: warm and dry Psychological: alert and cooperative; normal mood and affect   Allergies  Allergen Reactions    . Shellfish Allergy Nausea And Vomiting, Swelling and Rash    Past Medical History:  Diagnosis Date  . Allergy    No family history on file. Social History   Socioeconomic History  . Marital status: Single    Spouse name: Not on file  . Number of children: Not on file  . Years of education: Not on file  . Highest education level: Not on file  Occupational History  . Not on file  Social Needs  . Financial resource strain: Not on file  . Food insecurity:    Worry: Not on file    Inability: Not on file  . Transportation needs:    Medical: Not on file    Non-medical: Not on file  Tobacco Use  . Smoking status: Never Smoker  . Smokeless tobacco: Never Used  Substance and Sexual Activity  . Alcohol use: Yes  . Drug use: No  . Sexual activity: Not on file  Lifestyle  . Physical activity:    Days per week: Not on file    Minutes per session: Not on file  . Stress: Not on file  Relationships  . Social connections:    Talks on phone: Not on file    Gets together: Not on file    Attends religious service: Not on file    Active member of club or organization: Not on file    Attends meetings of clubs or organizations: Not on file    Relationship status: Not on file  . Intimate partner violence:    Fear of current or ex partner: Not on file    Emotionally  abused: Not on file    Physically abused: Not on file    Forced sexual activity: Not on file  Other Topics Concern  . Not on file  Social History Narrative  . Not on file           Vanessa Kick, MD 05/01/18 [phone]

## 2018-10-12 ENCOUNTER — Other Ambulatory Visit: Payer: Self-pay

## 2018-10-12 DIAGNOSIS — Z20822 Contact with and (suspected) exposure to covid-19: Secondary | ICD-10-CM

## 2018-10-13 LAB — NOVEL CORONAVIRUS, NAA: SARS-CoV-2, NAA: NOT DETECTED

## 2019-03-26 ENCOUNTER — Encounter (HOSPITAL_COMMUNITY): Payer: Self-pay

## 2019-03-26 ENCOUNTER — Other Ambulatory Visit: Payer: Self-pay

## 2019-03-26 ENCOUNTER — Ambulatory Visit (HOSPITAL_COMMUNITY)
Admission: EM | Admit: 2019-03-26 | Discharge: 2019-03-26 | Disposition: A | Discharge status: Home / Self Care | Payer: Commercial Managed Care - PPO | Attending: Physician Assistant | Admitting: Physician Assistant

## 2019-03-26 DIAGNOSIS — A084 Viral intestinal infection, unspecified: Secondary | ICD-10-CM | POA: Diagnosis not present

## 2019-03-26 DIAGNOSIS — R030 Elevated blood-pressure reading, without diagnosis of hypertension: Secondary | ICD-10-CM | POA: Diagnosis not present

## 2019-03-26 MED ORDER — ONDANSETRON HCL 4 MG PO TABS: 4.0000 mg | ORAL_TABLET | Freq: Three times a day (TID) | ORAL | 0 refills | Status: DC | PRN

## 2019-03-26 NOTE — ED Triage Notes (Signed)
Pt presents with generalized abdominal pain, diarrhea, and headache X 2 days.  Pt states he had a covid test done at another urgent care earlier and it was negative but he was not able to see a provider.

## 2019-03-26 NOTE — Discharge Instructions (Addendum)
Take the zofran as needed for nausea.   Drink plenty of water and include gatorade   Eat small bland meals   If your symptoms return or worsen return to clinic  Call the internal medicine clinic to establish care and follow up on your blood pressure

## 2019-03-26 NOTE — ED Provider Notes (Signed)
[facility]    CSN: [id number] Arrival date & time: 03/26/19  1944      History   Chief Complaint Chief Complaint  Patient presents with  . Abdominal Pain  . Diarrhea  . Headache    HPI Jon Holmes is a 59 y.o. male.   Patient reports to urgent care today for Two day history diarrhea and headache. He states that had diarrhea that has now mostly resolved. He notes diarrhea was worse on the first day and has gradually improved to now formed stool. No blood in stool. He was experiencing abdominal cramping and pain with those bowel movements that has also subsided. He reports some nausea that is still present as well but no vomiting. Endorses a headache for the duration of the nausea and vomiting but that also has mostly subsided. He denies cough, sore throat, congestion.   He took over the counter headache medications.  He was tested today at another urgent care for Covid but did not see a provider.   He is interested in getting a primary care provider and concerned about his blood pressure and health maintenance.      Past Medical History:  Diagnosis Date  . Allergy     Patient Active Problem List   Diagnosis Date Noted  . 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 HYPERTROPHY PROSTATE W/UR OBST & OTH LUTS 04/03/2009  . RASH AND OTHER NONSPECIFIC SKIN ERUPTION 04/03/2009    Past Surgical History:  Procedure Laterality Date  . 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 Authorizing Provider  guaiFENesin (ROBITUSSIN) 100 MG/5ML liquid Take 200 mg by mouth 3 (three) times daily as needed for cough.    [provider]  HYDROcodone-homatropine (HYCODAN) 5-1.5 MG/5ML syrup Take 5 mLs by mouth every 6 (six) hours as needed for cough. 04/25/18   Vanessa Kick, MD  ondansetron (ZOFRAN) 4 MG  tablet Take 1 tablet (4 mg total) by mouth every 8 (eight) hours as needed for nausea or vomiting. 03/26/19   Kenitra Leventhal, Marguerita Beards, PA-C    Family History History reviewed. No pertinent family history.  Social History Social History   Tobacco Use  . Smoking status: Never Smoker  . Smokeless tobacco: Never Used  Substance Use Topics  . Alcohol use: Yes  . Drug use: No     Allergies   Shellfish allergy   Review of Systems Review of Systems  Constitutional: Negative for chills and fever.  HENT: Negative for congestion, rhinorrhea, sinus pressure, sinus pain and sore throat.   Eyes: Negative.   Respiratory: Negative for cough and shortness of breath.   Cardiovascular: Negative for chest pain and palpitations.  Gastrointestinal: Positive for abdominal pain, diarrhea and nausea. Negative for vomiting.  Genitourinary: Negative.   Musculoskeletal: Negative for arthralgias, back pain and myalgias.  Skin: Negative for rash.  Neurological: Positive for headaches.     Physical Exam Triage Vital Signs ED Triage Vitals  Enc Vitals Group     BP 03/26/19 2007 (!) 135/113     Pulse Rate 03/26/19 2007 62     Resp 03/26/19 2007 16     Temp 03/26/19 2007 98.8 F (37.1 C)     Temp Source 03/26/19 2007 Oral     SpO2 03/26/19 2007 100 %  Weight --      Height --      Head Circumference --      Peak Flow --      Pain Score 03/26/19 2015 6     Pain Loc --      Pain Edu? --      Excl. in [place]? --    No data found.  Updated Vital Signs BP (!) 145/90 (BP Location: Left Arm)   Pulse 62   Temp 98.8 F (37.1 C) (Oral)   Resp 16   SpO2 100%   Visual Acuity Right Eye Distance:   Left Eye Distance:   Bilateral Distance:    Right Eye Near:   Left Eye Near:    Bilateral Near:     Physical Exam Vitals and nursing note reviewed.  Constitutional:      General: He is not in acute distress.    Appearance: He is well-developed and normal weight. He is not ill-appearing.  HENT:      Head: Normocephalic and atraumatic.  Eyes:     General: No scleral icterus.    Extraocular Movements: Extraocular movements intact.     Conjunctiva/sclera: Conjunctivae normal.     Pupils: Pupils are equal, round, and reactive to light.  Cardiovascular:     Rate and Rhythm: Normal rate and regular rhythm.     Heart sounds: No murmur. No friction rub. No gallop.   Pulmonary:     Effort: Pulmonary effort is normal. No respiratory distress.     Breath sounds: Normal breath sounds.  Abdominal:     General: Abdomen is protuberant. Bowel sounds are normal.     Palpations: Abdomen is soft.     Tenderness: There is no abdominal tenderness.  Musculoskeletal:     Cervical back: Neck supple.  Skin:    General: Skin is warm and dry.     Findings: No rash.  Neurological:     General: No focal deficit present.     Mental Status: He is alert and oriented to person, place, and time.  Psychiatric:        Mood and Affect: Mood normal.        Behavior: Behavior normal.      UC Treatments / Results  Labs (all labs ordered are listed, but only abnormal results are displayed) Labs Reviewed - No data to display  EKG   Radiology No results found.  Procedures Procedures (including critical care time)  Medications Ordered in UC Medications - No data to display  Initial Impression / Assessment and Plan / UC Course  I have reviewed the triage vital signs and the nursing notes.  Pertinent labs & imaging results that were available during my care of the patient were reviewed by me and considered in my medical decision making (see chart for details).     #Viral Gastroenteritis #elevated blood pressure - Viral GI complaint. All improved. No URI symptoms. Zofran given. Discussed precautions. Encourage PO intake - Blood pressure mildly elevated. Discussed need to establish with PCP for blood pressure recheck and health maintenance. Internal medicine clinic information given.   Final  Clinical Impressions(s) / UC Diagnoses   Final diagnoses:  Viral gastroenteritis  Elevated blood-pressure reading without diagnosis of hypertension     Discharge Instructions     Take the zofran as needed for nausea.   Drink plenty of water and include gatorade   Eat small bland meals   If your symptoms return or worsen return to clinic  Call the internal medicine clinic to establish care and follow up on your blood pressure       ED Prescriptions    Medication Sig Dispense Auth. Provider   ondansetron (ZOFRAN) 4 MG tablet Take 1 tablet (4 mg total) by mouth every 8 (eight) hours as needed for nausea or vomiting. 4 tablet Huan Pollok, Marguerita Beards, PA-C     PDMP not reviewed this encounter.   Purnell Shoemaker, PA-C 03/26/19 [phone]

## 2019-03-29 ENCOUNTER — Other Ambulatory Visit: Payer: Self-pay

## 2019-03-29 ENCOUNTER — Encounter (HOSPITAL_COMMUNITY): Payer: Self-pay | Admitting: *Deleted

## 2019-03-29 ENCOUNTER — Emergency Department (HOSPITAL_COMMUNITY)
Admission: EM | Admit: 2019-03-29 | Discharge: 2019-03-30 | Disposition: A | Discharge status: Home / Self Care | Payer: Commercial Managed Care - PPO | Attending: Emergency Medicine | Admitting: Emergency Medicine

## 2019-03-29 DIAGNOSIS — R42 Dizziness and giddiness: Secondary | ICD-10-CM | POA: Diagnosis not present

## 2019-03-29 DIAGNOSIS — R809 Proteinuria, unspecified: Secondary | ICD-10-CM | POA: Diagnosis not present

## 2019-03-29 DIAGNOSIS — E860 Dehydration: Secondary | ICD-10-CM

## 2019-03-29 DIAGNOSIS — R519 Headache, unspecified: Secondary | ICD-10-CM | POA: Diagnosis present

## 2019-03-29 LAB — BASIC METABOLIC PANEL
Anion gap: 12 (ref 5–15)
BUN: 14 mg/dL (ref 6–20)
CO2: 24 mmol/L (ref 22–32)
Calcium: 8.8 mg/dL — ABNORMAL LOW (ref 8.9–10.3)
Chloride: 104 mmol/L (ref 98–111)
Creatinine, Ser: 1.31 mg/dL — ABNORMAL HIGH (ref 0.61–1.24)
GFR calc Af Amer: >60 mL/min (ref >60)
GFR calc non Af Amer: 59 mL/min — ABNORMAL LOW (ref >60)
Glucose, Bld: 94 mg/dL (ref 70–99)
Potassium: 4 mmol/L (ref 3.5–5.1)
Sodium: 140 mmol/L (ref 135–145)

## 2019-03-29 LAB — CBC
HCT: 44.9 % (ref 39.0–52.0)
Hemoglobin: 14.4 g/dL (ref 13.0–17.0)
MCH: 27.4 pg (ref 26.0–34.0)
MCHC: 32.1 g/dL (ref 30.0–36.0)
MCV: 85.5 fL (ref 80.0–100.0)
Platelets: 293 10*3/uL (ref 150–400)
RBC: 5.25 MIL/uL (ref 4.22–5.81)
RDW: 13.9 % (ref 11.5–15.5)
WBC: 6.1 10*3/uL (ref 4.0–10.5)
nRBC: 0 % (ref 0.0–0.2)

## 2019-03-29 LAB — URINALYSIS, ROUTINE W REFLEX MICROSCOPIC
Bilirubin Urine: NEGATIVE
Glucose, UA: NEGATIVE mg/dL
Hgb urine dipstick: NEGATIVE
Ketones, ur: NEGATIVE mg/dL
Leukocytes,Ua: NEGATIVE
Nitrite: NEGATIVE
Protein, ur: 100 mg/dL — AB
Specific Gravity, Urine: 1.031 — ABNORMAL HIGH (ref 1.005–1.030)
pH: 5 (ref 5.0–8.0)

## 2019-03-29 MED ORDER — SODIUM CHLORIDE 0.9% FLUSH: 3.0000 mL | Freq: Once | INTRAVENOUS | Status: DC

## 2019-03-29 NOTE — ED Triage Notes (Signed)
Pt reports that he was at work and became lightheaded. He says that he has had some headaches on an off for about 3 days. Frontal headache, no nausea, no photosensitivity. Generalized weakness. Denies fevers. Recent negative covid.

## 2019-03-30 MED ORDER — SODIUM CHLORIDE 0.9 % IV BOLUS
1000.0000 mL | Freq: Once | INTRAVENOUS | Status: AC
  Administered 2019-03-30: 1000 mL via INTRAVENOUS

## 2019-03-30 NOTE — Discharge Instructions (Signed)
Thank you for allowing me to care for you today in the Emergency Department.   You were seen today for headache and lightheadedness.  You were treated with a liter of fluids for dehydration.  This is probably where you were ill with diarrhea earlier this week.  Try to make sure that you are drinking at least 64 ounces of fluids daily.  Even more when you are at work since you have a job that requires a lot of physical activity.  Call the number on your discharge paperwork to get established with a primary care provider.  I would like them to repeat your metabolic panel since your kidney function was slightly elevated today, which is new.  You also had a small amount of protein in your urine so please have them repeat a urinalysis to make sure that this has resolved.  Return to the emergency department if you develop a headache with new numbness or weakness, slurred speech or facial drooping, confusion and fevers, if you become unable to move your neck, develop persistent changes in your vision, vision loss, or other new, concerning symptoms.

## 2019-03-30 NOTE — ED Provider Notes (Signed)
[facility] EMERGENCY DEPARTMENT Provider Note   CSN: [id number] Arrival date & time: 03/29/19  2038     History Chief Complaint  Patient presents with  . Dizziness    Jon Holmes is a 59 y.o. male history of secondary syphilis s/p treatment who presents to the emergency department with a chief complaint of headache.  The patient reports that he has had an intermittent headache over the last 3 days.  Headache is located on the top of his head.  He is unable to characterize the headache.  He reports that his headache worsened today while he was at work where he has a very active, physically demanding job.  Headache has been improving since he arrived in the ER.  Currently rates his headache as a 4 out of 10.  He has been treating his headache with ibuprofen and Tylenol.  He reports associated lightheadedness.  States that over the last few days that when he stands up he gets lightheaded and feels as if he might pass out.  He also reports that his vision gets blurry along with the lightheadedness and then quickly resolves.  He denies dizziness, tinnitus, nausea, vomiting, fever, chills, shortness of breath, numbness, weakness, slurred speech, facial droop, neck pain or stiffness. No dysuria or hematuria.  Reports that he has a longstanding history of intermittent numbness in his bilateral hands, which is not worsened recently.  He also reports that he has been having intermittent burning chest pain for the last year, but reports that this has not changed in quality or intensity recently.  He was seen at urgent care on 2/2 for a 2-day history of diarrhea and headache.  Diarrhea had mostly resolved by the time that he was seen in urgent care.  No melena or hematochezia.  He was having some abdominal cramping at that time, but this is resolved.  He also denies nausea and vomiting.    Reports that his job is very physically active and demanding.  The history is provided by the  patient. No language interpreter was used.       Past Medical History:  Diagnosis Date  . Allergy     Patient Active Problem List   Diagnosis Date Noted  . 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 HYPERTROPHY PROSTATE W/UR OBST & OTH LUTS 04/03/2009  . RASH AND OTHER NONSPECIFIC SKIN ERUPTION 04/03/2009    Past Surgical History:  Procedure Laterality Date  . TUMOR REMOVAL     fatty tissue tumor from back       No family history on file.  Social History   Tobacco Use  . Smoking status: Never Smoker  . Smokeless tobacco: Never Used  Substance Use Topics  . Alcohol use: Yes  . Drug use: No    Home Medications Prior to Admission medications   Not on File    Allergies    Shellfish allergy  Review of Systems   Review of Systems  Constitutional: Negative for appetite change, chills and fever.  Respiratory: Negative for shortness of breath.   Cardiovascular: Negative for chest pain.  Gastrointestinal: Positive for abdominal pain (resolved) and diarrhea (resolved).  Endocrine: Negative for polyuria.  Genitourinary: Negative for dysuria, flank pain, frequency and urgency.  Musculoskeletal: Negative for back pain.  Skin: Negative for rash.  Allergic/Immunologic: Negative for immunocompromised state.  Neurological: Positive for light-headedness and headaches. Negative for dizziness, syncope, weakness and numbness.  Psychiatric/Behavioral: Negative for  confusion.    Physical Exam Updated Vital Signs BP 120/72 (BP Location: Right Arm)   Pulse (!) 52   Temp 98.2 F (36.8 C) (Oral)   Resp 18   Ht 5\' 8"  (1.727 m)   Wt 86.2 kg   SpO2 98%   BMI 28.89 kg/m   Physical Exam Vitals and nursing note reviewed.  Constitutional:      General: He is not in acute distress.    Appearance: He is well-developed. He is not ill-appearing, toxic-appearing or diaphoretic.  HENT:     Head: Normocephalic.      Mouth/Throat:     Comments: Lips are slightly cracked Eyes:     Extraocular Movements: Extraocular movements intact.     Conjunctiva/sclera: Conjunctivae normal.     Pupils: Pupils are equal, round, and reactive to light.  Cardiovascular:     Rate and Rhythm: Normal rate and regular rhythm.     Heart sounds: No murmur.  Pulmonary:     Effort: Pulmonary effort is normal. No respiratory distress.     Breath sounds: No stridor. No wheezing, rhonchi or rales.  Chest:     Chest wall: No tenderness.  Abdominal:     General: There is no distension.     Palpations: Abdomen is soft. There is no mass.     Tenderness: There is no abdominal tenderness. There is no right CVA tenderness, left CVA tenderness, guarding or rebound.     Hernia: No hernia is present.  Musculoskeletal:     Cervical back: Neck supple.     Right lower leg: No edema.     Left lower leg: No edema.  Skin:    General: Skin is warm and dry.     Capillary Refill: Capillary refill takes less than 2 seconds.     Coloration: Skin is not jaundiced.  Neurological:     Mental Status: He is alert.     Comments: Alert oriented x3.  Cranial nerves II through XII are grossly intact.  5 of 5 strength against resistance of the bilateral upper and lower extremities.  No dysmetria with finger-to-nose bilaterally.  Sensation is intact and equal throughout.  Ambulates without ataxia.  Negative Romberg.  No pronator drift.  Psychiatric:        Behavior: Behavior normal.     ED Results / Procedures / Treatments   Labs (all labs ordered are listed, but only abnormal results are displayed) Labs Reviewed  BASIC METABOLIC PANEL - Abnormal; Notable for the following components:      Result Value   Creatinine, Ser 1.31 (*)    Calcium 8.8 (*)    GFR calc non Af Amer 59 (*)    All other components within normal limits  URINALYSIS, ROUTINE W REFLEX MICROSCOPIC - Abnormal; Notable for the following components:   Specific Gravity, Urine 1.031  (*)    Protein, ur 100 (*)    Bacteria, UA RARE (*)    All other components within normal limits  CBC    EKG None  Radiology No results found.  Procedures Procedures (including critical care time)  Medications Ordered in ED Medications  sodium chloride flush (NS) 0.9 % injection 3 mL (has no administration in time range)  sodium chloride 0.9 % bolus 1,000 mL (0 mLs Intravenous Stopped 03/30/19 0339)    ED Course  I have reviewed the triage vital signs and the nursing notes.  Pertinent labs & imaging results that were available during my care of the  patient were reviewed by me and considered in my medical decision making (see chart for details).    MDM Rules/Calculators/A&P                      59 year old male history of secondary syphilis s/p treatment presenting with an intermittent headache over the last 3 days accompanied by lightheadedness with standing.  He was having diarrhea and abdominal cramping earlier in the week and was seen at urgent care and diagnosed with viral gastroenteritis.  He is borderline bradycardic, but is asymptomatic.  He has no complaints of chest pain.  No focal neurologic deficits on exam.  He has previously had secondary syphilis, but this was treated appropriately several years ago.  CT head is not indicated at this time.  He does have a new AKI and his urine has elevated specific gravity.  Given lightheadedness with standing, strongly suspect this is hypovolemia secondary to recent fluid loss from viral gastroenteritis.  Orthostatic vital signs are negative.  The patient was given a liter of IV fluids and on reexamination, he reports that his headache has resolved.  He has no longer feeling lightheaded.  Reports that he is feeling markedly better than when he came to the ER.  Doubt CVA, ICH, meningitis, COVID-19 as he tested negative earlier this week.  The patient is in the process of getting established with a primary care provider.  I encouraged him to  have a repeat metabolic panel to recheck his creatinine level since it was slightly elevated today.  He also has a mild proteinuria, which was new, on his urinalysis today.  Encourage the patient to have a repeat urinalysis in a primary care setting to ensure that this resolves.  He is hemodynamically stable and in no acute distress.  Safe for discharge to home with outpatient follow-up as indicated.  Final Clinical Impression(s) / ED Diagnoses Final diagnoses:  Dehydration  Proteinuria, unspecified type    Rx / DC Orders ED Discharge Orders    None       Joanne Gavel, PA-C 03/30/19 0408    Maudie Flakes, MD 04/04/19 [phone]

## 2019-03-30 NOTE — ED Notes (Signed)
Pt came to the ED per triage complaint. Pt conscious, breathing, and A&Ox4. Pt ambulated to bay [address] with a smooth and steady gait. Pt endorses "I've been dizzy for the past three days". Chest rise and fall equally with non-labored breathing. Lungs clear apex to base. Abd soft and non-tender. Pt denies chest pain, n/v/d, shortness of breath, and f/c. Bed in lowest position with call light within reach. Pt on continuous blood pressure, pulse ox, and cardiac monitor. Will continue to monitor. Awaiting MD eval. No distress noted.

## 2019-03-30 NOTE — ED Notes (Signed)
Pt was discharged from the ED. Pt read and understood discharge paperwork. Pt had vital signs completed. E-signature not available. Pt conscious, breathing, and A&Ox4. No distress noted. Pt speaking in complete sentences. Pt ambulated out of the ED with a smooth and steady gait.

## 2019-06-15 ENCOUNTER — Ambulatory Visit: Payer: Commercial Managed Care - PPO

## 2019-09-23 ENCOUNTER — Ambulatory Visit (HOSPITAL_COMMUNITY)
Admission: EM | Admit: 2019-09-23 | Discharge: 2019-09-23 | Disposition: A | Discharge status: Home / Self Care | Payer: Commercial Managed Care - PPO | Attending: Family Medicine | Admitting: Family Medicine

## 2019-09-23 ENCOUNTER — Encounter (HOSPITAL_COMMUNITY): Payer: Self-pay | Admitting: Emergency Medicine

## 2019-09-23 ENCOUNTER — Other Ambulatory Visit: Payer: Self-pay

## 2019-09-23 DIAGNOSIS — Z20822 Contact with and (suspected) exposure to covid-19: Secondary | ICD-10-CM | POA: Insufficient documentation

## 2019-09-23 DIAGNOSIS — R5383 Other fatigue: Secondary | ICD-10-CM

## 2019-09-23 DIAGNOSIS — K5900 Constipation, unspecified: Secondary | ICD-10-CM | POA: Insufficient documentation

## 2019-09-23 LAB — COMPREHENSIVE METABOLIC PANEL
ALT: 26 U/L (ref 0–44)
AST: 30 U/L (ref 15–41)
Albumin: 3.8 g/dL (ref 3.5–5.0)
Alkaline Phosphatase: 91 U/L (ref 38–126)
Anion gap: 12 (ref 5–15)
BUN: 11 mg/dL (ref 6–20)
CO2: 22 mmol/L (ref 22–32)
Calcium: 9 mg/dL (ref 8.9–10.3)
Chloride: 103 mmol/L (ref 98–111)
Creatinine, Ser: 1.02 mg/dL (ref 0.61–1.24)
GFR calc Af Amer: >60 mL/min (ref >60)
GFR calc non Af Amer: >60 mL/min (ref >60)
Glucose, Bld: 112 mg/dL — ABNORMAL HIGH (ref 70–99)
Potassium: 4.3 mmol/L (ref 3.5–5.1)
Sodium: 137 mmol/L (ref 135–145)
Total Bilirubin: 0.4 mg/dL (ref 0.3–1.2)
Total Protein: 6.5 g/dL (ref 6.5–8.1)

## 2019-09-23 LAB — CBC WITH DIFFERENTIAL/PLATELET
Abs Immature Granulocytes: 0.05 10*3/uL (ref 0.00–0.07)
Basophils Absolute: 0.1 10*3/uL (ref 0.0–0.1)
Basophils Relative: 1 %
Eosinophils Absolute: 0.2 10*3/uL (ref 0.0–0.5)
Eosinophils Relative: 3 %
HCT: 47 % (ref 39.0–52.0)
Hemoglobin: 15.5 g/dL (ref 13.0–17.0)
Immature Granulocytes: 1 %
Lymphocytes Relative: 35 %
Lymphs Abs: 2.4 10*3/uL (ref 0.7–4.0)
MCH: 28.1 pg (ref 26.0–34.0)
MCHC: 33 g/dL (ref 30.0–36.0)
MCV: 85.3 fL (ref 80.0–100.0)
Monocytes Absolute: 0.7 10*3/uL (ref 0.1–1.0)
Monocytes Relative: 9 %
Neutro Abs: 3.6 10*3/uL (ref 1.7–7.7)
Neutrophils Relative %: 51 %
Platelets: 305 10*3/uL (ref 150–400)
RBC: 5.51 MIL/uL (ref 4.22–5.81)
RDW: 14.1 % (ref 11.5–15.5)
WBC: 7 10*3/uL (ref 4.0–10.5)
nRBC: 0 % (ref 0.0–0.2)

## 2019-09-23 LAB — TSH: TSH: 1.354 u[IU]/mL (ref 0.350–4.500)

## 2019-09-23 LAB — HEMOGLOBIN A1C
Hgb A1c MFr Bld: 6 % — ABNORMAL HIGH (ref 4.8–5.6)
Mean Plasma Glucose: 125.5 mg/dL

## 2019-09-23 NOTE — ED Notes (Signed)
Obtained covid swab, labeled swab, verified fluid in tube and secured lid.

## 2019-09-23 NOTE — ED Triage Notes (Signed)
Started feeling bad approx 4-5 days ago.  Patient feeling extreme fatigue No cough, cold, runny nose

## 2019-09-23 NOTE — ED Provider Notes (Signed)
[facility]    CSN: [id number] Arrival date & time: 09/23/19  1641      History   Chief Complaint Chief Complaint  Patient presents with  . URI    HPI Gilliam Hawkes is a 59 y.o. male.   Minh Roanhorse presents with complaints of fatigue, which has been ongoing for a year now. Over the past 4-5 days has been more aware of the fatigue, although it hasn't worsened. Activity increases his fatigue. 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 Nothing necessarily new today. States when he wakes up he still feels tired. He feels thirsty a lot, drinks a lot of water. No polyuria, however. No URI symptoms. He deals with constipation, no blood in stool. No previous colonoscopy. Denies any lower extremity edema. No chest pain. No specific shortness of breath. Doesn't have a PCP. Requests note to provide to his work as he missed work today.    ROS per HPI, negative if not otherwise mentioned.      Past Medical History:  Diagnosis Date  . Allergy     Patient Active Problem List   Diagnosis Date Noted  . 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 HYPERTROPHY PROSTATE W/UR OBST & OTH LUTS 04/03/2009  . RASH AND OTHER NONSPECIFIC SKIN ERUPTION 04/03/2009    Past Surgical History:  Procedure Laterality Date  . TUMOR REMOVAL     fatty tissue tumor from back       Home Medications    Prior to Admission medications   Not on File    Family History History reviewed. No pertinent family history.  Social History Social History   Tobacco Use  . Smoking status: Never Smoker  . Smokeless tobacco: Never Used  Substance Use Topics  . Alcohol use: Yes  . Drug use: No     Allergies   Shellfish allergy   Review of Systems Review of Systems   Physical Exam Triage Vital Signs ED Triage Vitals  Enc Vitals Group     BP 09/23/19 1733 (!) 145/83     Pulse  Rate 09/23/19 1733 66     Resp 09/23/19 1733 18     Temp 09/23/19 1733 98.4 F (36.9 C)     Temp Source 09/23/19 1733 Oral     SpO2 09/23/19 1733 100 %     Weight --      Height --      Head Circumference --      Peak Flow --      Pain Score 09/23/19 1730 0     Pain Loc --      Pain Edu? --      Excl. in [place]? --    No data found.  Updated Vital Signs BP (!) 145/83 (BP Location: Left Arm)   Pulse 66   Temp 98.4 F (36.9 C) (Oral)   Resp 18   SpO2 100%    Physical Exam Constitutional:      Appearance: He is well-developed.  HENT:     Head: Normocephalic and atraumatic.     Mouth/Throat:     Mouth: Mucous membranes are moist.  Cardiovascular:     Rate and Rhythm: Normal rate and regular rhythm.     Pulses: Normal pulses.  Pulmonary:     Effort: Pulmonary effort is normal.  Musculoskeletal:     Right lower leg: No  edema.     Left lower leg: No edema.  Skin:    General: Skin is warm and dry.  Neurological:     Mental Status: He is alert and oriented to person, place, and time.      UC Treatments / Results  Labs (all labs ordered are listed, but only abnormal results are displayed) Labs Reviewed  COMPREHENSIVE METABOLIC PANEL - Abnormal; Notable for the following components:      Result Value   Glucose, Bld 112 (*)    All other components within normal limits  HEMOGLOBIN A1C - Abnormal; Notable for the following components:   Hgb A1c MFr Bld 6.0 (*)    All other components within normal limits  SARS CORONAVIRUS 2 (TAT 6-24 HRS)  CBC WITH DIFFERENTIAL/PLATELET  TSH    EKG   Radiology No results found.  Procedures Procedures (including critical care time)  Medications Ordered in UC Medications - No data to display  Initial Impression / Assessment and Plan / UC Course  I have reviewed the triage vital signs and the nursing notes.  Pertinent labs & imaging results that were available during my care of the patient were reviewed by me and considered  in my medical decision making (see chart for details).     Non toxic. Benign physical exam.  No acute complaints today. Likely needs sleep study, colonoscopy screening. Basic labs including hgba1c and tsh collected. Emphasized follow up with PCP for recheck as may need further evaluation if no findings with labs today. Patient verbalized understanding and agreeable to plan.   Final Clinical Impressions(s) / UC Diagnoses   Final diagnoses:  Fatigue, unspecified type     Discharge Instructions     I am running some basic labs today to see if I can find any obvious source of your fatigue.  We will notify of you any positive findings or if any changes to treatment are needed. If normal or otherwise without concern to your results, we will not call you. Please log on to your MyChart to review your results if interested in so.   Please establish with a primary care provider as you will likely need additional evaluation and management.     ED Prescriptions    None     PDMP not reviewed this encounter.   Zigmund Gottron, NP 09/24/19 1318

## 2019-09-23 NOTE — Discharge Instructions (Signed)
I am running some basic labs today to see if I can find any obvious source of your fatigue.  We will notify of you any positive findings or if any changes to treatment are needed. If normal or otherwise without concern to your results, we will not call you. Please log on to your MyChart to review your results if interested in so.   Please establish with a primary care provider as you will likely need additional evaluation and management.

## 2019-09-24 LAB — SARS CORONAVIRUS 2 (TAT 6-24 HRS): SARS Coronavirus 2: NEGATIVE

## 2019-10-10 ENCOUNTER — Other Ambulatory Visit: Payer: Self-pay

## 2019-10-10 ENCOUNTER — Ambulatory Visit (HOSPITAL_COMMUNITY)
Admission: EM | Admit: 2019-10-10 | Discharge: 2019-10-10 | Disposition: A | Discharge status: Home / Self Care | Payer: HRSA Program | Attending: Family Medicine | Admitting: Family Medicine

## 2019-10-10 ENCOUNTER — Encounter (HOSPITAL_COMMUNITY): Payer: Self-pay

## 2019-10-10 DIAGNOSIS — J069 Acute upper respiratory infection, unspecified: Secondary | ICD-10-CM | POA: Insufficient documentation

## 2019-10-10 DIAGNOSIS — Z20822 Contact with and (suspected) exposure to covid-19: Secondary | ICD-10-CM | POA: Diagnosis not present

## 2019-10-10 LAB — SARS CORONAVIRUS 2 (TAT 6-24 HRS): SARS Coronavirus 2: NEGATIVE

## 2019-10-10 MED ORDER — FLUTICASONE PROPIONATE 50 MCG/ACT NA SUSP: 1.0000 | Freq: Every day | NASAL | 0 refills | Status: DC

## 2019-10-10 MED ORDER — BENZONATATE 100 MG PO CAPS: 100.0000 mg | ORAL_CAPSULE | Freq: Three times a day (TID) | ORAL | 0 refills | Status: AC

## 2019-10-10 NOTE — ED Provider Notes (Signed)
[facility]    CSN: [id number] Arrival date & time: 10/10/19  1225      History   Chief Complaint Chief Complaint  Patient presents with  . Cough    HPI Jon Holmes is a 59 y.o. male.   Patient presents for cough over the last 4 to 5 days.  He reports burning sensation in his chest with cough.  Reports cough is nonproductive.  Denies shortness of breath.  Denies chest pain other than the burning feeling with coughing.  He is also had some nasal congestion around the same time.  Believes he may have had a fever the other day.  Denies sore throat, nausea, vomiting, diarrhea.  No known sick contacts.  Also wondering about his lab results from recent visit for fatigue.  Reviewed these with the patient.  He reports over the last year he has been having some short winded feeling with exertion.This is no worse than it has been.  Denies any chest pain or shortness of breath here in clinic.  He reports the fatigue he was seen for 2 weeks ago is improved.  He does not have a primary care and has never seen a cardiologist.     Past Medical History:  Diagnosis Date  . Allergy     Patient Active Problem List   Diagnosis Date Noted  . 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 HYPERTROPHY PROSTATE W/UR OBST & OTH LUTS 04/03/2009  . RASH AND OTHER NONSPECIFIC SKIN ERUPTION 04/03/2009    Past Surgical History:  Procedure Laterality Date  . 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 Authorizing Provider  benzonatate (TESSALON) 100 MG capsule Take 1-2 capsules (100-200 mg total) by mouth 3 (three) times daily for 10 days. 10/10/19 10/20/19  Aswad Wandrey, Marguerita Beards, PA-C  fluticasone (FLONASE) 50 MCG/ACT nasal spray Place 1 spray into both nostrils daily for 10 days. 10/10/19 10/20/19  Taneal Sonntag, Marguerita Beards, PA-C    Family  History History reviewed. No pertinent family history.  Social History Social History   Tobacco Use  . Smoking status: Never Smoker  . Smokeless tobacco: Never Used  Substance Use Topics  . Alcohol use: Yes  . Drug use: No     Allergies   Shellfish allergy   Review of Systems Review of Systems   Physical Exam Triage Vital Signs ED Triage Vitals  Enc Vitals Group     BP      Pulse      Resp      Temp      Temp src      SpO2      Weight      Height      Head Circumference      Peak Flow      Pain Score      Pain Loc      Pain Edu?      Excl. in [place]?    No data found.  Updated Vital Signs BP 138/76   Pulse 86   Temp 98.2 F (36.8 C)   Resp 16   SpO2 100%   Visual Acuity Right Eye Distance:   Left Eye Distance:   Bilateral Distance:    Right Eye Near:   Left Eye Near:    Bilateral Near:  Physical Exam Vitals and nursing note reviewed.  Constitutional:      General: He is not in acute distress.    Appearance: He is well-developed. He is not ill-appearing.  HENT:     Head: Normocephalic and atraumatic.     Nose: Congestion present.     Mouth/Throat:     Mouth: Mucous membranes are moist.  Eyes:     Conjunctiva/sclera: Conjunctivae normal.  Cardiovascular:     Rate and Rhythm: Normal rate and regular rhythm.     Heart sounds: No murmur heard.   Pulmonary:     Effort: Pulmonary effort is normal. No respiratory distress.     Breath sounds: Normal breath sounds. No wheezing, rhonchi or rales.     Comments: Speaking in complete sentences. No accessory usage Abdominal:     Palpations: Abdomen is soft.     Tenderness: There is no abdominal tenderness.  Musculoskeletal:     Cervical back: Neck supple.     Right lower leg: No edema.     Left lower leg: No edema.  Skin:    General: Skin is warm and dry.  Neurological:     Mental Status: He is alert.      UC Treatments / Results  Labs (all labs ordered are listed, but only abnormal  results are displayed) Labs Reviewed  SARS CORONAVIRUS 2 (TAT 6-24 HRS)    EKG   Radiology No results found.  Procedures Procedures (including critical care time)  Medications Ordered in UC Medications - No data to display  Initial Impression / Assessment and Plan / UC Course  I have reviewed the triage vital signs and the nursing notes.  Pertinent labs & imaging results that were available during my care of the patient were reviewed by me and considered in my medical decision making (see chart for details).     #Viral URI Patient is a 59 year old without major medical history presenting with viral upper respiratory symptoms. Normal vital signs. Reassuring exam. No CP or SOB today. Will have him establish with internal medicine and follow up with Cardiology concerning his chronic SOB with exertion symptoms. Recent labs all reassuring. Symptomatic care of his presenting symptoms today. Discussed return, ED and follow up precautions. Patient verbalized understanding plan of care.  Final Clinical Impressions(s) / UC Diagnoses   Final diagnoses:  Viral URI with cough     Discharge Instructions     Take the cough medicine as prescribed Use flonase daily  Call the internal medicine center to establish with a primary care, take soonest appointment  Call the cardiology clinic to discuss follow up on your fatigue with exertion  If severe chest pain, shortness of breath, feel you are going to pass out go to the Emergency department      ED Prescriptions    Medication Sig Dispense Auth. Provider   benzonatate (TESSALON) 100 MG capsule Take 1-2 capsules (100-200 mg total) by mouth 3 (three) times daily for 10 days. 60 capsule Darrien Laakso, Marguerita Beards, PA-C   fluticasone (FLONASE) 50 MCG/ACT nasal spray Place 1 spray into both nostrils daily for 10 days. 1 g Jataya Wann, Marguerita Beards, PA-C     PDMP not reviewed this encounter.   Purnell Shoemaker, PA-C 10/10/19 2318

## 2019-10-10 NOTE — ED Triage Notes (Signed)
Pt c/o nonproductive cough x 2 weeks, "my chest feels like its burning when I cough." Pt here 2 weeks ago for fatigue and states was never notified of test results

## 2019-10-10 NOTE — Discharge Instructions (Addendum)
Take the cough medicine as prescribed Use flonase daily  Call the internal medicine center to establish with a primary care, take soonest appointment  Call the cardiology clinic to discuss follow up on your fatigue with exertion  If severe chest pain, shortness of breath, feel you are going to pass out go to the Emergency department

## 2021-08-11 ENCOUNTER — Ambulatory Visit: Payer: Self-pay | Admitting: Family Medicine

## 2021-09-30 ENCOUNTER — Ambulatory Visit: Payer: Self-pay | Admitting: Family Medicine

## 2021-10-26 ENCOUNTER — Encounter (HOSPITAL_COMMUNITY): Payer: Self-pay

## 2021-10-26 ENCOUNTER — Ambulatory Visit (HOSPITAL_COMMUNITY)
Admission: EM | Admit: 2021-10-26 | Discharge: 2021-10-26 | Disposition: A | Discharge status: Home / Self Care | Payer: BC Managed Care – PPO | Attending: Family Medicine | Admitting: Family Medicine

## 2021-10-26 DIAGNOSIS — J069 Acute upper respiratory infection, unspecified: Secondary | ICD-10-CM | POA: Diagnosis not present

## 2021-10-26 DIAGNOSIS — Z1152 Encounter for screening for COVID-19: Secondary | ICD-10-CM | POA: Diagnosis not present

## 2021-10-26 LAB — RESP PANEL BY RT-PCR (FLU A&B, COVID) ARPGX2
Influenza A by PCR: NEGATIVE
Influenza B by PCR: NEGATIVE
SARS Coronavirus 2 by RT PCR: NEGATIVE

## 2021-10-26 MED ORDER — BENZONATATE 200 MG PO CAPS: 200.0000 mg | ORAL_CAPSULE | Freq: Three times a day (TID) | ORAL | 0 refills | Status: DC | PRN

## 2021-10-26 NOTE — ED Provider Notes (Signed)
[facility]    CSN: [id number] Arrival date & time: 10/26/21  1445      History   Chief Complaint Chief Complaint  Patient presents with   Cough    HPI Jon Holmes is a 61 y.o. male.   Pt is here for 3 days of cough, congestion, body aches, loss of taste x 3 days.  He just feels very fatigued.  No sob or difficulty breathing.  He did have fever the day before yesterday.  He did take mucinex and did cough up some phlegm.         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 No pertinent family history.  Social History Social History   Tobacco Use   Smoking status: Never   Smokeless tobacco: Never  Substance Use Topics   Alcohol use: Yes   Drug use: No     Allergies   Shellfish allergy   Review of Systems Review of Systems  Constitutional:  Positive for fatigue. Negative for chills and fever.  HENT:  Positive for congestion. Negative for sore throat and trouble swallowing.   Respiratory:  Positive for cough.   Cardiovascular: Negative.   Gastrointestinal: Negative.   Genitourinary: Negative.   Musculoskeletal: Negative.   Psychiatric/Behavioral: Negative.       Physical Exam Triage Vital Signs ED Triage Vitals  Enc Vitals Group     BP 10/26/21 1514 (!) 144/96     Pulse Rate 10/26/21 1514 75     Resp 10/26/21 1514 18     Temp 10/26/21 1514 99.1 F (37.3 C)     Temp Source 10/26/21 1514 Oral     SpO2 10/26/21 1514 96 %     Weight --      Height --       Head Circumference --      Peak Flow --      Pain Score 10/26/21 1515 8     Pain Loc --      Pain Edu? --      Excl. in [place]? --    No data found.  Updated Vital Signs BP (!) 144/96 (BP Location: Left Arm)   Pulse 75   Temp 99.1 F (37.3 C) (Oral)   Resp 18   SpO2 96%   Visual Acuity Right Eye Distance:   Left Eye Distance:   Bilateral Distance:    Right Eye Near:   Left Eye Near:    Bilateral Near:     Physical Exam Constitutional:      Appearance: Normal appearance.  HENT:     Head: Normocephalic and atraumatic.     Nose: Congestion present.  Cardiovascular:     Rate and Rhythm: Normal rate and regular rhythm.  Pulmonary:     Effort: Pulmonary effort is normal.     Breath sounds: Normal breath sounds.  Musculoskeletal:     Cervical back: Normal range of motion and  neck supple. No tenderness.  Neurological:     General: No focal deficit present.     Mental Status: He is alert.  Psychiatric:        Mood and Affect: Mood normal.      UC Treatments / Results  Labs (all labs ordered are listed, but only abnormal results are displayed) Labs Reviewed  RESP PANEL BY RT-PCR (FLU A&B, COVID) [id number]    EKG   Radiology No results found.  Procedures Procedures (including critical care time)  Medications Ordered in UC Medications - No data to display  Initial Impression / Assessment and Plan / UC Course  I have reviewed the triage vital signs and the nursing notes.  Pertinent labs & imaging results that were available during my care of the patient were reviewed by me and considered in my medical decision making (see chart for details).     Final Clinical Impressions(s) / UC Diagnoses   Final diagnoses:  Viral URI with cough  Encounter for screening for COVID-19     Discharge Instructions      You were seen today for upper respiratory symptoms.  I have tested you for covid today, and this will be resulted by later today.  If positive we will  call and notify you.  Your results should be visible in mychart as well.  I have sent out a pill to help with cough.  You may also use over the counter tylenol or motrin to help with pain or fever.  Please get plenty of rest and fluids as well.  Please return if you are feeling worse over the next several days.     ED Prescriptions     Medication Sig Dispense Auth. Provider   benzonatate (TESSALON) 200 MG capsule Take 1 capsule (200 mg total) by mouth 3 (three) times daily as needed for cough. 21 capsule Rondel Oh, MD      PDMP not reviewed this encounter.   Rondel Oh, MD 10/26/21 [phone]

## 2021-10-26 NOTE — Discharge Instructions (Addendum)
You were seen today for upper respiratory symptoms.  I have tested you for covid today, and this will be resulted by later today.  If positive we will call and notify you.  Your results should be visible in mychart as well.  I have sent out a pill to help with cough.  You may also use over the counter tylenol or motrin to help with pain or fever.  Please get plenty of rest and fluids as well.  Please return if you are feeling worse over the next several days.

## 2021-10-26 NOTE — ED Triage Notes (Signed)
Pt c/o cough, congestion, headaches, fatigue, and loss of taste x3 days.

## 2022-03-21 ENCOUNTER — Other Ambulatory Visit: Payer: Self-pay

## 2022-03-21 ENCOUNTER — Ambulatory Visit (HOSPITAL_COMMUNITY)
Admission: EM | Admit: 2022-03-21 | Discharge: 2022-03-21 | Disposition: A | Discharge status: Home / Self Care | Payer: BC Managed Care – PPO | Attending: Internal Medicine | Admitting: Internal Medicine

## 2022-03-21 ENCOUNTER — Encounter (HOSPITAL_COMMUNITY): Payer: Self-pay | Admitting: *Deleted

## 2022-03-21 DIAGNOSIS — J069 Acute upper respiratory infection, unspecified: Secondary | ICD-10-CM

## 2022-03-21 DIAGNOSIS — S161XXA Strain of muscle, fascia and tendon at neck level, initial encounter: Secondary | ICD-10-CM

## 2022-03-21 MED ORDER — BENZONATATE 200 MG PO CAPS: 200.0000 mg | ORAL_CAPSULE | Freq: Three times a day (TID) | ORAL | 0 refills | Status: DC | PRN

## 2022-03-21 MED ORDER — METHOCARBAMOL 500 MG PO TABS: 500.0000 mg | ORAL_TABLET | Freq: Two times a day (BID) | ORAL | 0 refills | Status: DC

## 2022-03-21 NOTE — Discharge Instructions (Signed)
You have a viral upper respiratory infection.  Use the following medicines to help with symptoms: - Plain Mucinex (guaifenesin) over the counter as directed every 12 hours to thin mucous so that you are able to get it out of your body easier. Drink plenty of water while taking this medication so that it works well in your body (at least 8 cups a day).  - Tylenol 1,'000mg'$  and/or ibuprofen '600mg'$  every 6 hours with food as needed for aches/pains or fever/chills.  - Tessalon perles every 8 hours as needed for cough.  Your pain is likely due to a muscle strain which will improve on its own with time.   - Take ibuprofen '600mg'$  with food every 6 hours as needed for pain and inflammation. Do not take any other NSAID containing medicine when taking ibuprofen.   - You may also take the prescribed muscle relaxer as directed as needed for muscle aches/spasm.  Do not take this medication and drive or drink alcohol as it can make you sleepy.  Mainly use this medicine at nighttime as needed. - Apply heat 20 minutes on then 20 minutes off and perform gentle range of motion exercises to the area of greatest pain to prevent muscle stiffness and provide further pain relief.   Red flag symptoms to watch out for are numbness/tingling to the legs, weakness, loss of bowel/bladder control, and/or worsening pain that does not respond well to medicines. Follow-up with your primary care provider or return to urgent care if your symptoms do not improve in the next 3 to 4 days with medications and interventions recommended today. If your symptoms are severe (red flag), please go to the emergency room.  I hope you feel better!

## 2022-03-21 NOTE — ED Provider Notes (Signed)
[place]    CSN: [id number] Arrival date & time: 03/21/22  1551      History   Chief Complaint Chief Complaint  Patient presents with   Fever   Torticollis   Shoulder Pain    HPI Jon Holmes is a 62 y.o. male.   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 Authorizing Provider  methocarbamol (ROBAXIN) 500 MG tablet Take 1 tablet (500 mg total) by mouth 2 (two) times daily. 03/21/22  Yes Talbot Grumbling, FNP  benzonatate (TESSALON) 200 MG capsule Take 1 capsule (200 mg total) by mouth 3 (three) times daily as needed for cough. 03/21/22   Talbot Grumbling, FNP    Family History History reviewed. No pertinent family history.  Social History Social History   Tobacco Use   Smoking status: Never   Smokeless tobacco: Never  Substance Use Topics   Alcohol use: Yes   Drug use: No     Allergies   Shellfish allergy   Review of Systems Review of Systems  Constitutional:  Positive for fever.     Physical Exam Triage Vital Signs ED Triage Vitals  Enc Vitals Group     BP 03/21/22  1754 (!) 167/84     Pulse Rate 03/21/22 1754 66     Resp 03/21/22 1754 18     Temp 03/21/22 1754 98.6 F (37 C)     Temp src --      SpO2 03/21/22 1754 96 %     Weight --      Height --      Head Circumference --      Peak Flow --      Pain Score 03/21/22 1752 9     Pain Loc --      Pain Edu? --      Excl. in [place]? --    No data found.  Updated Vital Signs BP (!) 167/84   Pulse 66   Temp 98.6 F (37 C)   Resp 18   SpO2 96%   Visual Acuity Right Eye Distance:   Left Eye Distance:   Bilateral Distance:    Right Eye Near:   Left Eye Near:    Bilateral Near:     Physical Exam   UC Treatments / Results  Labs (all labs ordered are listed, but only abnormal results are displayed) Labs Reviewed - No data to display  EKG   Radiology No results found.  Procedures Procedures (including critical care time)  Medications Ordered in UC Medications - No data to display  Initial Impression / Assessment and Plan / UC Course  I have reviewed the triage vital signs and the nursing notes.  Pertinent labs & imaging results that were available during my care of the patient were reviewed by me and considered in my medical decision making (see chart for details).     *** Final Clinical Impressions(s) / UC Diagnoses   Final diagnoses:  Acute strain of neck muscle, initial encounter  Viral URI with cough     Discharge Instructions      You have a viral upper respiratory infection.  Use the following medicines to help with symptoms: - Plain Mucinex (guaifenesin) over the counter as directed every 12 hours to thin mucous so that you are able to get it out of your body easier. Drink plenty of water while taking this medication so that it works well in your body (at least 8 cups a day).  - Tylenol 1,'000mg'$  and/or ibuprofen '600mg'$  every 6 hours with food as needed for aches/pains or fever/chills.  - Tessalon perles every 8 hours as needed for cough.  Your pain is likely  due to a muscle strain which will improve on its own with time.   - Take ibuprofen '600mg'$  with food every 6 hours as needed for pain and inflammation. Do not take any other NSAID containing medicine when taking ibuprofen.   - You may also take the prescribed muscle relaxer as directed as needed for muscle aches/spasm.  Do not take this medication and drive or drink alcohol as it can make you sleepy.  Mainly use this medicine at nighttime as needed. - Apply heat 20 minutes on then 20 minutes off and perform gentle range of motion exercises to the area of greatest pain to prevent muscle stiffness and provide further pain relief.   Red flag symptoms to watch out for are numbness/tingling to the legs, weakness, loss of bowel/bladder control, and/or worsening pain that does not respond well to medicines. Follow-up with your primary care provider or return to urgent care if your symptoms do not improve in the next 3 to 4 days with medications and interventions recommended today. If your symptoms are severe (red flag), please go to the emergency room.  I hope you feel better!    ED Prescriptions     Medication Sig Dispense Auth. Provider   benzonatate (TESSALON) 200 MG capsule Take 1 capsule (200 mg total) by mouth 3 (three) times daily as needed for cough. 21 capsule Joella Prince M, FNP   methocarbamol (ROBAXIN) 500 MG tablet Take 1 tablet (500 mg total) by mouth 2 (two) times daily. 20 tablet Talbot Grumbling, FNP      PDMP not reviewed this encounter.

## 2022-03-21 NOTE — ED Triage Notes (Signed)
Pt reports Rt neck and shoulder pain for 2 weeks. Pt has also had a fever and sinus issues for 8 days.Pt also reports RT upper leg pain for one week.

## 2022-04-04 ENCOUNTER — Telehealth (HOSPITAL_COMMUNITY): Payer: Self-pay | Admitting: Emergency Medicine

## 2022-04-04 NOTE — Telephone Encounter (Signed)
Patient left voicemail asking for refill of muscle relaxer from visit a few weeks ago.   Patient will need to be seen again. Attempted to reach patient x 1, VM is full

## 2022-04-14 ENCOUNTER — Ambulatory Visit: Payer: BC Managed Care – PPO | Admitting: Internal Medicine

## 2022-05-03 ENCOUNTER — Other Ambulatory Visit: Payer: Self-pay

## 2022-05-03 ENCOUNTER — Ambulatory Visit (HOSPITAL_COMMUNITY)
Admission: EM | Admit: 2022-05-03 | Discharge: 2022-05-03 | Disposition: A | Discharge status: Home / Self Care | Payer: BC Managed Care – PPO | Attending: Family Medicine | Admitting: Family Medicine

## 2022-05-03 ENCOUNTER — Encounter (HOSPITAL_COMMUNITY): Payer: Self-pay | Admitting: Emergency Medicine

## 2022-05-03 DIAGNOSIS — R059 Cough, unspecified: Secondary | ICD-10-CM

## 2022-05-03 DIAGNOSIS — J309 Allergic rhinitis, unspecified: Secondary | ICD-10-CM | POA: Diagnosis not present

## 2022-05-03 MED ORDER — FEXOFENADINE HCL 180 MG PO TABS: 180.0000 mg | ORAL_TABLET | Freq: Every day | ORAL | 0 refills | Status: DC

## 2022-05-03 MED ORDER — PREDNISONE 10 MG (21) PO TBPK: ORAL_TABLET | Freq: Every day | ORAL | 0 refills | Status: DC

## 2022-05-03 MED ORDER — DOXYCYCLINE HYCLATE 100 MG PO CAPS: 100.0000 mg | ORAL_CAPSULE | Freq: Two times a day (BID) | ORAL | 0 refills | Status: AC

## 2022-05-03 NOTE — ED Triage Notes (Signed)
Pt c/o fatigue, congestion and soreness for about 2 months. Hasn't taken any medication for symptoms.   Also c/o right upper leg burning sensation for a month. Reports sensation is intermittent   Reports works around a lot of chemicals and skin complexion has changed.

## 2022-05-03 NOTE — ED Provider Notes (Signed)
[name]    CSN: [id number] Arrival date & time: 05/03/22  1529      History   Chief Complaint Chief Complaint  Patient presents with   Nasal Congestion   Fatigue    HPI Jon Holmes is a 62 y.o. male.   HPI Very pleasant 62 year old male presents with nasal congestion, fatigue, and soreness for 2 months.  PMH significant for secondary syphilis, syphilis of liver, hypertrophy of prostate.  Patient reports that he is scheduled to establish care with new PCP tomorrow morning, Wednesday, 05/04/2022.  Past Medical History:  Diagnosis Date   Allergy     Patient Active Problem List   Diagnosis Date Noted   Syphilis-secondary 03/27/2013   Syphilis of liver 03/27/2013   DYSPLASTIC NEVUS 04/03/2009   HYPERTROPHY PROSTATE W/UR OBST & OTH LUTS 04/03/2009   RASH AND OTHER NONSPECIFIC SKIN ERUPTION 04/03/2009    Past Surgical History:  Procedure Laterality Date   TUMOR REMOVAL     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 Authorizing Provider  doxycycline (VIBRAMYCIN) 100 MG capsule Take 1 capsule (100 mg total) by mouth 2 (two) times daily for 7 days. 05/03/22 05/10/22 Yes Eliezer Lofts, FNP  fexofenadine [facility] ALLERGY) 180 MG tablet Take 1 tablet (180 mg total) by mouth daily for 15 days. 05/03/22 05/18/22 Yes Eliezer Lofts, FNP  predniSONE (STERAPRED UNI-PAK 21 TAB) 10 MG (21) TBPK tablet Take by mouth daily. Take 6 tabs by mouth daily  for 2 days, then 5 tabs for 2 days, then 4 tabs for 2 days, then 3 tabs for 2 days, 2 tabs for 2 days, then 1 tab by mouth daily for 2 days 05/03/22  Yes Eliezer Lofts, FNP    Family History History reviewed. No pertinent family history.  Social History Social History   Tobacco Use   Smoking status: Never   Smokeless tobacco: Never  Substance Use Topics   Alcohol use: Yes   Drug use: No     Allergies   Shellfish  allergy   Review of Systems Review of Systems  Constitutional:  Positive for fatigue.  HENT:  Positive for congestion.   Musculoskeletal:  Positive for arthralgias and myalgias.  All other systems reviewed and are negative.    Physical Exam Triage Vital Signs ED Triage Vitals  Enc Vitals Group     BP 05/03/22 1600 (!) 146/93     Pulse Rate 05/03/22 1600 68     Resp 05/03/22 1600 16     Temp 05/03/22 1600 98.3 F (36.8 C)     Temp Source 05/03/22 1600 Oral     SpO2 05/03/22 1600 97 %     Weight --      Height --      Head Circumference --      Peak Flow --      Pain Score 05/03/22 1558 8     Pain Loc --      Pain Edu? --      Excl. in [place]? --    No data found.  Updated Vital Signs BP (!) 146/93 (BP Location: Left Arm)   Pulse 68   Temp 98.3 F (36.8 C) (Oral)   Resp 16   SpO2 97%      Physical Exam Vitals and nursing note reviewed.  Constitutional:      Appearance: Normal appearance. He  is obese. He is ill-appearing.  HENT:     Head: Normocephalic and atraumatic.     Right Ear: Tympanic membrane, ear canal and external ear normal.     Left Ear: Tympanic membrane, ear canal and external ear normal.     Mouth/Throat:     Mouth: Mucous membranes are moist.     Pharynx: Oropharynx is clear.     Comments: Significant amount of clear drainage of posterior oropharynx noted Eyes:     Extraocular Movements: Extraocular movements intact.     Conjunctiva/sclera: Conjunctivae normal.     Pupils: Pupils are equal, round, and reactive to light.  Cardiovascular:     Rate and Rhythm: Normal rate and regular rhythm.     Pulses: Normal pulses.     Heart sounds: Normal heart sounds.  Pulmonary:     Effort: Pulmonary effort is normal.     Breath sounds: Normal breath sounds. No wheezing, rhonchi or rales.     Comments: Infrequent nonproductive cough noted on exam Musculoskeletal:        General: Normal range of motion.     Cervical back: Normal range of motion and neck  supple.  Skin:    General: Skin is warm and dry.  Neurological:     General: No focal deficit present.     Mental Status: He is alert and oriented to person, place, and time. Mental status is at baseline.      UC Treatments / Results  Labs (all labs ordered are listed, but only abnormal results are displayed) Labs Reviewed - No data to display  EKG   Radiology No results found.  Procedures Procedures (including critical care time)  Medications Ordered in UC Medications - No data to display  Initial Impression / Assessment and Plan / UC Course  I have reviewed the triage vital signs and the nursing notes.  Pertinent labs & imaging results that were available during my care of the patient were reviewed by me and considered in my medical decision making (see chart for details).     MDM: 1. Cough-Rx'd Doxycycline, Sterapred Unipak; 2.  Allergic rhinitis-Rx'd Allegra. Instructed patient to take medications as directed with food to completion.  Advised patient to take Allegra and Prednisone with first dose of Doxycycline for the next 10 days.  Advised may use Allegra as needed afterwards for concurrent postnasal drainage/drip.  Work note provided to patient prior to discharge per request.  Patient discharged home, hemodynamically stable. Final Clinical Impressions(s) / UC Diagnoses   Final diagnoses:  Cough, unspecified type  Allergic rhinitis, unspecified seasonality, unspecified trigger     Discharge Instructions      Instructed patient to take medications as directed with food to completion.  Advised patient to take Allegra and Prednisone with first dose of Doxycycline for the next 10 days.  Advised may use Allegra as needed afterwards for concurrent postnasal drainage/drip.  Encouraged patient to increase daily water intake to 64 ounces per day while taking these medications.  Advised if symptoms worsen and/or unresolved please follow-up with PCP or here for further  evaluation.     ED Prescriptions     Medication Sig Dispense Auth. Provider   doxycycline (VIBRAMYCIN) 100 MG capsule Take 1 capsule (100 mg total) by mouth 2 (two) times daily for 7 days. 14 capsule Eliezer Lofts, FNP   predniSONE (STERAPRED UNI-PAK 21 TAB) 10 MG (21) TBPK tablet Take by mouth daily. Take 6 tabs by mouth daily  for 2 days,  then 5 tabs for 2 days, then 4 tabs for 2 days, then 3 tabs for 2 days, 2 tabs for 2 days, then 1 tab by mouth daily for 2 days 42 tablet Eliezer Lofts, FNP   fexofenadine [facility] ALLERGY) 180 MG tablet Take 1 tablet (180 mg total) by mouth daily for 15 days. 15 tablet Eliezer Lofts, FNP      PDMP not reviewed this encounter.   Eliezer Lofts, Ginger Blue 05/03/22 1650

## 2022-05-03 NOTE — Discharge Instructions (Addendum)
Instructed patient to take medications as directed with food to completion.  Advised patient to take Allegra and Prednisone with first dose of Doxycycline for the next 10 days.  Advised may use Allegra as needed afterwards for concurrent postnasal drainage/drip.  Encouraged patient to increase daily water intake to 64 ounces per day while taking these medications.  Advised if symptoms worsen and/or unresolved please follow-up with PCP or here for further evaluation.

## 2022-05-04 ENCOUNTER — Ambulatory Visit: Payer: BC Managed Care – PPO | Admitting: Internal Medicine

## 2022-06-04 ENCOUNTER — Ambulatory Visit (HOSPITAL_COMMUNITY)
Admission: EM | Admit: 2022-06-04 | Discharge: 2022-06-04 | Disposition: A | Discharge status: Home / Self Care | Payer: BC Managed Care – PPO | Attending: Nurse Practitioner | Admitting: Nurse Practitioner

## 2022-06-04 DIAGNOSIS — S161XXA Strain of muscle, fascia and tendon at neck level, initial encounter: Secondary | ICD-10-CM | POA: Diagnosis not present

## 2022-06-04 MED ORDER — METHOCARBAMOL 500 MG PO TABS: 500.0000 mg | ORAL_TABLET | Freq: Two times a day (BID) | ORAL | 0 refills | Status: DC

## 2022-06-04 NOTE — ED Triage Notes (Signed)
Pt presents to the office for right sided neck and shoulder pain for several days. Denies any injuries or trauma.

## 2022-06-04 NOTE — Discharge Instructions (Addendum)
The muscles in your neck are inflamed.  Continue Tylenol 500 to 1000 mg every 6 hours as needed for pain.  You can also take the Robaxin twice daily as needed for muscular pain.  Please start the stretches that are included in this packet-this includes light range of motion exercises.  You can continue heat for the pain.  Follow-up with the sports medicine provider if your pain persist or worsen despite treatment

## 2022-06-04 NOTE — ED Provider Notes (Signed)
[facility]    CSN: [id number] Arrival date & time: 06/04/22  1341      History   Chief Complaint Chief Complaint  Patient presents with   Neck Pain   Shoulder Pain    HPI Jon Holmes is a 62 y.o. male.   Patient presents today for right-sided neck pain that has been ongoing for the past several days.  Reports he has been dealing with this on and off for the past year.  Denies recent accident, fall, trauma, or injury to the neck or arm.  Reports the pain is moderate to severe and worse with certain positions.  No numbness or tingling in the fingertips, upper extremity weakness, or decrease sensation in the upper extremities.  No headache, vision changes, dizziness or lightheadedness.  Patient reports he is able to move his right arm, however it is just painful.  Has been using warm compresses and Tylenol for pain with minimal improvement.    Past Medical History:  Diagnosis Date   Allergy     Patient Active Problem List   Diagnosis Date Noted   Syphilis-secondary 03/27/2013   Syphilis of liver 03/27/2013   DYSPLASTIC NEVUS 04/03/2009   HYPERTROPHY PROSTATE W/UR OBST & OTH LUTS 04/03/2009   RASH AND OTHER NONSPECIFIC SKIN ERUPTION 04/03/2009    Past Surgical History:  Procedure Laterality Date   TUMOR REMOVAL     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 Authorizing Provider  methocarbamol (ROBAXIN) 500 MG tablet Take 1 tablet (500 mg total) by mouth 2 (two) times daily. Do not take with alcohol or while driving or operating heavy machinery.  May cause drowsiness. 06/04/22  Yes Valentino Nose, NP  fexofenadine [facility] ALLERGY) 180 MG tablet Take 1 tablet (180 mg total) by mouth daily for 15 days. 05/03/22 05/18/22  Trevor Iha, FNP    Family History No family history on file.  Social History Social History   Tobacco Use   Smoking status: Never    Smokeless tobacco: Never  Substance Use Topics   Alcohol use: Yes   Drug use: No     Allergies   Shellfish allergy   Review of Systems Review of Systems Per HPI  Physical Exam Triage Vital Signs ED Triage Vitals [06/04/22 1428]  Enc Vitals Group     BP (!) 171/84     Pulse Rate (!) 58     Resp 16     Temp 98.6 F (37 C)     Temp Source Oral     SpO2 96 %     Weight      Height      Head Circumference      Peak Flow      Pain Score      Pain Loc      Pain Edu?      Excl. in [place]?    No data found.  Updated Vital Signs BP (!) 171/84 (BP Location: Left Arm)   Pulse (!) 58   Temp 98.6 F (37 C) (Oral)   Resp 16   SpO2 96%   Visual Acuity Right Eye Distance:   Left Eye Distance:   Bilateral Distance:    Right Eye Near:   Left Eye Near:    Bilateral Near:     Physical Exam Vitals and nursing note reviewed.  Constitutional:  General: He is not in acute distress.    Appearance: Normal appearance. He is not toxic-appearing.  Neck:     Thyroid: No thyroid mass.      Comments: Inspection: No swelling, obvious deformity, or redness to right neck or upper back  Palpation: Right upper back tender to palpation in area marked; no obvious deformities palpated ROM: Full ROM to neck, right upper extremity Strength: 5/5 neck, bilateral upper extremities Neurovascular: neurovascularly intact in left and right upper extremity  Pulmonary:     Effort: Pulmonary effort is normal. No respiratory distress.  Musculoskeletal:     Cervical back: No edema. Pain with movement and muscular tenderness present. No spinous process tenderness.     Right lower leg: No edema.     Left lower leg: No edema.  Lymphadenopathy:     Cervical: No cervical adenopathy.  Skin:    General: Skin is warm and dry.     Capillary Refill: Capillary refill takes less than 2 seconds.     Coloration: Skin is not jaundiced or pale.     Findings: No erythema.  Neurological:     Mental Status:  He is alert and oriented to person, place, and time.  Psychiatric:        Behavior: Behavior is cooperative.      UC Treatments / Results  Labs (all labs ordered are listed, but only abnormal results are displayed) Labs Reviewed - No data to display  EKG   Radiology No results found.  Procedures Procedures (including critical care time)  Medications Ordered in UC Medications - No data to display  Initial Impression / Assessment and Plan / UC Course  I have reviewed the triage vital signs and the nursing notes.  Pertinent labs & imaging results that were available during my care of the patient were reviewed by me and considered in my medical decision making (see chart for details).   Patient is well-appearing, afebrile, not tachycardic, not tachypneic, oxygenating well on room air.  Patient is mildly hypertensive today in urgent care.  Otherwise, vital signs are stable.  1. Strain of neck muscle, initial encounter Treat with muscle relaxant, ice alternating with heat, light range of motion and stretching exercises and exercises given Can continue Tylenol as needed for pain Recommended follow-up with sports medicine with no improvement worsening symptoms despite treatment  The patient was given the opportunity to ask questions.  All questions answered to their satisfaction.  The patient is in agreement to this plan.   Final Clinical Impressions(s) / UC Diagnoses   Final diagnoses:  Strain of neck muscle, initial encounter     Discharge Instructions      The muscles in your neck are inflamed.  Continue Tylenol 500 to 1000 mg every 6 hours as needed for pain.  You can also take the Robaxin twice daily as needed for muscular pain.  Please start the stretches that are included in this packet-this includes light range of motion exercises.  You can continue heat for the pain.  Follow-up with the sports medicine provider if your pain persist or worsen despite treatment    ED  Prescriptions     Medication Sig Dispense Auth. Provider   methocarbamol (ROBAXIN) 500 MG tablet Take 1 tablet (500 mg total) by mouth 2 (two) times daily. Do not take with alcohol or while driving or operating heavy machinery.  May cause drowsiness. 20 tablet Valentino Nose, NP      PDMP not reviewed this encounter.  Valentino Nose, NP 06/04/22 [phone]

## 2022-06-10 ENCOUNTER — Ambulatory Visit (INDEPENDENT_AMBULATORY_CARE_PROVIDER_SITE_OTHER): Payer: BC Managed Care – PPO | Admitting: Family Medicine

## 2022-06-10 ENCOUNTER — Encounter: Payer: Self-pay | Admitting: Family Medicine

## 2022-06-10 VITALS — BP 140/96 | HR 52 | Temp 97.9°F | Ht 69.0 in | Wt 203.8 lb

## 2022-06-10 DIAGNOSIS — I10 Essential (primary) hypertension: Secondary | ICD-10-CM | POA: Diagnosis not present

## 2022-06-10 DIAGNOSIS — R202 Paresthesia of skin: Secondary | ICD-10-CM

## 2022-06-10 DIAGNOSIS — A539 Syphilis, unspecified: Secondary | ICD-10-CM

## 2022-06-10 DIAGNOSIS — Z1211 Encounter for screening for malignant neoplasm of colon: Secondary | ICD-10-CM

## 2022-06-10 DIAGNOSIS — R739 Hyperglycemia, unspecified: Secondary | ICD-10-CM

## 2022-06-10 LAB — CBC WITH DIFFERENTIAL/PLATELET
Basophils Absolute: 0.1 10*3/uL (ref 0.0–0.1)
Basophils Relative: 1.4 % (ref 0.0–3.0)
Eosinophils Absolute: 0.2 10*3/uL (ref 0.0–0.7)
Eosinophils Relative: 3.7 % (ref 0.0–5.0)
HCT: 45.3 % (ref 39.0–52.0)
Hemoglobin: 15 g/dL (ref 13.0–17.0)
Lymphocytes Relative: 38.9 % (ref 12.0–46.0)
Lymphs Abs: 2.4 10*3/uL (ref 0.7–4.0)
MCHC: 33.1 g/dL (ref 30.0–36.0)
MCV: 85.9 fl (ref 78.0–100.0)
Monocytes Absolute: 0.7 10*3/uL (ref 0.1–1.0)
Monocytes Relative: 10.7 % (ref 3.0–12.0)
Neutro Abs: 2.8 10*3/uL (ref 1.4–7.7)
Neutrophils Relative %: 45.3 % (ref 43.0–77.0)
Platelets: 279 10*3/uL (ref 150.0–400.0)
RBC: 5.28 Mil/uL (ref 4.22–5.81)
RDW: 14.3 % (ref 11.5–15.5)
WBC: 6.2 10*3/uL (ref 4.0–10.5)

## 2022-06-10 LAB — LIPID PANEL
Cholesterol: 217 mg/dL — ABNORMAL HIGH (ref 0–200)
HDL: 49.5 mg/dL (ref >39.00)
LDL Cholesterol: 132 mg/dL — ABNORMAL HIGH (ref 0–99)
NonHDL: 167.34
Total CHOL/HDL Ratio: 4
Triglycerides: 177 mg/dL — ABNORMAL HIGH (ref 0.0–149.0)
VLDL: 35.4 mg/dL (ref 0.0–40.0)

## 2022-06-10 LAB — COMPREHENSIVE METABOLIC PANEL
ALT: 41 U/L (ref 0–53)
AST: 37 U/L (ref 0–37)
Albumin: 4.5 g/dL (ref 3.5–5.2)
Alkaline Phosphatase: 91 U/L (ref 39–117)
BUN: 12 mg/dL (ref 6–23)
CO2: 24 mEq/L (ref 19–32)
Calcium: 9.4 mg/dL (ref 8.4–10.5)
Chloride: 105 mEq/L (ref 96–112)
Creatinine, Ser: 1.1 mg/dL (ref 0.40–1.50)
GFR: 71.71 mL/min (ref >60.00)
Glucose, Bld: 89 mg/dL (ref 70–99)
Potassium: 4.4 mEq/L (ref 3.5–5.1)
Sodium: 140 mEq/L (ref 135–145)
Total Bilirubin: 0.5 mg/dL (ref 0.2–1.2)
Total Protein: 7.5 g/dL (ref 6.0–8.3)

## 2022-06-10 LAB — TSH: TSH: 1.83 u[IU]/mL (ref 0.35–5.50)

## 2022-06-10 LAB — HEMOGLOBIN A1C: Hgb A1c MFr Bld: 6.2 % (ref 4.6–6.5)

## 2022-06-10 LAB — VITAMIN B12: Vitamin B-12: 300 pg/mL (ref 211–911)

## 2022-06-10 NOTE — Assessment & Plan Note (Signed)
I reviewed previous BP's in the computer and they are are consistently elevated, it is likely that pt is developing HTN. I will order TSH, CMP, lipid panel, A1C to assess other cardiac risk factors and secondary causes of HTN. Will decide which medication he would need after the results of the labs are back.

## 2022-06-10 NOTE — Patient Instructions (Signed)
Reduce salt intake, try to exercise 30 minutes of walking daily, reduce alcohol intake to 1-2 glasses per day

## 2022-06-10 NOTE — Assessment & Plan Note (Signed)
No residual symptoms since he was treated, will check new RPR which should be seronegative this far out from his infection

## 2022-06-10 NOTE — Progress Notes (Signed)
New Patient Office Visit  Subjective    Patient ID: Jon Holmes, male    DOB: October 15, 1960  Age: 62 y.o. MRN: [id number]  CC:  Chief Complaint  Patient presents with   Establish Care    HPI Jon Holmes presents to establish care Pt reports he did not have a previous primary care provider. Pt reports a history of right sided neck pain that has been off and on for some time. Thinks that it is the way he is sleeping on it.  Happens on just the right side only, is a side sleeper, is trying to change his sleep position, has used a pillow under his neck. Reports that the right arm is starting to "feel weird" also his right leg is falling asleep, some burning as well. Was given muscle relaxers from the ED and reports that it has been helping. Has also been taking OTC NSAIDS.   Pt states that he has put on some weight recently, occasional problems with constipation, but states that everything else seems ok. 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  I have reviewed all aspects of the patient's medical history including social, family, and surgical history. Pt had a history of syphilis about 9 years ago, did have repeat testing 2 months after treatment, but none since. Reports no symptoms in relation to this infection.   Outpatient Encounter Medications as of 06/10/2022  Medication Sig   methocarbamol (ROBAXIN) 500 MG tablet Take 1 tablet (500 mg total) by mouth 2 (two) times daily. Do not take with alcohol or while driving or operating heavy machinery.  May cause drowsiness.   [DISCONTINUED] fexofenadine (ALLEGRA ALLERGY) 180 MG tablet Take 1 tablet (180 mg total) by mouth daily for 15 days.   No facility-administered encounter medications on file as of 06/10/2022.    Past Medical History:  Diagnosis Date   Allergy     Past Surgical History:  Procedure Laterality Date   TUMOR REMOVAL     fatty tissue tumor from back     Family History  Problem Relation Age of Onset   Hypertension Mother    Glaucoma Maternal Grandfather     Social History   Socioeconomic History   Marital status: Single    Spouse name: Not on file   Number of children: Not on file   Years of education: Not on file   Highest education level: Not on file  Occupational History   Not on file  Tobacco Use   Smoking status: Never   Smokeless tobacco: Never  Vaping Use   Vaping Use: Never used  Substance and Sexual Activity   Alcohol use: Yes   Drug use: No   Sexual activity: Not Currently  Other Topics Concern   Not on file  Social History Narrative   Not on file   Social Determinants of Health   Financial Resource Strain: Not on file  Food Insecurity: Not on file  Transportation Needs: Not on file  Physical Activity: Not on file  Stress: Not on file  Social Connections: Not on file  Intimate Partner Violence: Not on file    Review of Systems  All other systems reviewed and are negative.       Objective    BP (!) 140/96 (BP Location: Right Arm, Patient Position: Sitting, Cuff Size: Large)   Pulse (!) 52   Temp 97.9 F (36.6 C) (Oral)   Ht   (1.753 m)   Wt 203 lb 12.8 oz (92.4 kg)   SpO2 98%   BMI 30.10 kg/m   Physical Exam Vitals reviewed.  Constitutional:      Appearance: Normal appearance. He is well-groomed and normal weight.  Eyes:     Extraocular Movements: Extraocular movements intact.     Conjunctiva/sclera: Conjunctivae normal.  Neck:     Thyroid: No thyromegaly.  Cardiovascular:     Rate and Rhythm: Normal rate and regular rhythm.     Heart sounds: S1 normal and S2 normal. No murmur heard. Pulmonary:     Effort: Pulmonary effort is normal.     Breath sounds: Normal breath sounds and air entry. No rales.  Abdominal:     General: Abdomen is flat. Bowel sounds are normal.  Musculoskeletal:     Right lower leg: No edema.     Left lower leg: No edema.  Neurological:     General:  No focal deficit present.     Mental Status: He is alert and oriented to person, place, and time.     Gait: Gait is intact.  Psychiatric:        Mood and Affect: Mood and affect normal.         Assessment & Plan:   Problem List Items Addressed This Visit       Unprioritized   Syphilis-secondary    No residual symptoms since he was treated, will check new RPR which should be seronegative this far out from his infection      Relevant Orders   RPR   Primary hypertension    I reviewed previous BP's in the computer and they are are consistently elevated, it is likely that pt is developing HTN. I will order TSH, CMP, lipid panel, A1C to assess other cardiac risk factors and secondary causes of HTN. Will decide which medication he would need after the results of the labs are back.       Relevant Orders   CMP   TSH   Lipid Panel   Other Visit Diagnoses     Paresthesia    -  Primary   right arm with intermittent chronic neck pain, also reports right leg burning and tingling. will order B12 level, may need EMG testing later.   Relevant Orders   CBC with Differential/Platelets   Vitamin B12   Colon cancer screening       Relevant Orders   Ambulatory referral to Gastroenterology   Hyperglycemia       Seen on previous labs, will check new A1C today, pt did comment on some weight gain recently.   Relevant Orders   Hemoglobin A1c       No follow-ups on file.   Karie Georges, MD

## 2022-06-11 LAB — RPR: RPR Ser Ql: NONREACTIVE

## 2022-08-01 ENCOUNTER — Encounter: Payer: Self-pay | Admitting: Gastroenterology

## 2022-08-01 ENCOUNTER — Telehealth: Payer: Self-pay | Admitting: Family Medicine

## 2022-08-01 NOTE — Telephone Encounter (Signed)
PCP previously entered a referral and it appears the GI office has tried reaching the patient.  Spoke with the patient and gave him the number to contact the GI office [phone].

## 2022-08-01 NOTE — Telephone Encounter (Signed)
Pt is requesting a colon screening to be done. He would like to know if he needs to come in to see the provider first in order to get a referral, or would the provider be able to put in the referral without an appointment?

## 2022-08-19 ENCOUNTER — Ambulatory Visit (AMBULATORY_SURGERY_CENTER): Payer: BC Managed Care – PPO

## 2022-08-19 ENCOUNTER — Other Ambulatory Visit: Payer: Self-pay

## 2022-08-19 VITALS — Ht 68.0 in | Wt 190.0 lb

## 2022-08-19 DIAGNOSIS — Z1211 Encounter for screening for malignant neoplasm of colon: Secondary | ICD-10-CM

## 2022-08-19 MED ORDER — NA SULFATE-K SULFATE-MG SULF 17.5-3.13-1.6 GM/177ML PO SOLN: 1.0000 | Freq: Once | ORAL | 0 refills | Status: AC

## 2022-08-19 NOTE — Progress Notes (Signed)
Denies allergies to eggs or soy products. Denies complication of anesthesia or sedation. Denies use of weight loss medication. Denies use of O2.   Emmi instructions given for colonoscopy.  

## 2022-09-02 ENCOUNTER — Encounter: Payer: Self-pay | Admitting: Gastroenterology

## 2022-09-12 ENCOUNTER — Encounter: Payer: Self-pay | Admitting: Gastroenterology

## 2022-09-12 ENCOUNTER — Ambulatory Visit (AMBULATORY_SURGERY_CENTER): Payer: BC Managed Care – PPO | Admitting: Gastroenterology

## 2022-09-12 VITALS — BP 163/88 | HR 60 | Temp 97.5°F | Resp 18 | Ht 68.0 in | Wt 190.0 lb

## 2022-09-12 DIAGNOSIS — D122 Benign neoplasm of ascending colon: Secondary | ICD-10-CM

## 2022-09-12 DIAGNOSIS — Z1211 Encounter for screening for malignant neoplasm of colon: Secondary | ICD-10-CM | POA: Diagnosis not present

## 2022-09-12 DIAGNOSIS — K635 Polyp of colon: Secondary | ICD-10-CM | POA: Diagnosis not present

## 2022-09-12 MED ORDER — SODIUM CHLORIDE 0.9 % IV SOLN: 500.0000 mL | Freq: Once | INTRAVENOUS | Status: DC

## 2022-09-12 NOTE — Progress Notes (Signed)
Called to room to assist during endoscopic procedure.  Patient ID and intended procedure confirmed with present staff. Received instructions for my participation in the procedure from the performing physician.  

## 2022-09-12 NOTE — Progress Notes (Signed)
Sedate, gd SR, tolerated procedure well, VSS, report to RN 

## 2022-09-12 NOTE — Patient Instructions (Signed)

## 2022-09-12 NOTE — Progress Notes (Signed)
History and Physical:  This patient presents for endoscopic testing for: Encounter Diagnosis  Name Primary?   Special screening for malignant neoplasms, colon Yes   Average risk for colorectal cancer.  First screening exam.   Patient denies chronic abdominal pain, rectal bleeding, constipation or diarrhea.   Patient is otherwise without complaints or active issues today.   Past Medical History: Past Medical History:  Diagnosis Date   Allergy    GERD (gastroesophageal reflux disease)      Past Surgical History: Past Surgical History:  Procedure Laterality Date   TUMOR REMOVAL     fatty tissue tumor from back    Allergies: Allergies  Allergen Reactions   Poison Ivy Extract Swelling   Shellfish Allergy Nausea And Vomiting, Swelling and Rash    Outpatient Meds: Current Outpatient Medications  Medication Sig Dispense Refill   OVER THE COUNTER MEDICATION Calcium one capsule daily.     OVER THE COUNTER MEDICATION Take by mouth daily.     OVER THE COUNTER MEDICATION Healthy heart support, two capsules daily.     Current Facility-Administered Medications  Medication Dose Route Frequency Provider Last Rate Last Admin   0.9 %  sodium chloride infusion  500 mL Intravenous Once Charlie Pitter III, MD          ___________________________________________________________________ Objective   Exam:  BP (!) 169/95   Pulse 64   Temp (!) 97.5 F (36.4 C) (Temporal)   Resp 14   Ht 5\' 8"  (1.727 m)   Wt 190 lb (86.2 kg)   SpO2 99%   BMI 28.89 kg/m   CV: regular , S1/S2 Resp: clear to auscultation bilaterally, normal RR and effort noted GI: soft, no tenderness, with active bowel sounds.   Assessment: Encounter Diagnosis  Name Primary?   Special screening for malignant neoplasms, colon Yes     Plan: Colonoscopy   The benefits and risks of the planned procedure were described in detail with the patient or (when appropriate) their health care proxy.  Risks were  outlined as including, but not limited to, bleeding, infection, perforation, adverse medication reaction leading to cardiac or pulmonary decompensation, pancreatitis (if ERCP).  The limitation of incomplete mucosal visualization was also discussed.  No guarantees or warranties were given.  The patient is appropriate for an endoscopic procedure in the ambulatory setting.   - Amada Jupiter, MD

## 2022-09-12 NOTE — Progress Notes (Signed)
VS completed by DT.  Pt's states no medical or surgical changes since previsit or office visit.  

## 2022-09-12 NOTE — Op Note (Signed)
[facility] Patient Name: Jon Holmes Procedure Date: 09/12/2022 12:24 PM MRN: [id number] Endoscopist: Sherilyn Cooter L. Myrtie Neither , MD, [id number] Age: 62 Referring MD:  Date of Birth: [birth year] Gender: Male Account #: [id number] Procedure:                Colonoscopy Indications:              Screening for colorectal malignant neoplasm, This                            is the patient's first colonoscopy Medicines:                Monitored Anesthesia Care Procedure:                Pre-Anesthesia Assessment:                           - Prior to the procedure, a History and Physical                            was performed, and patient medications and                            allergies were reviewed. The patient's tolerance of                            previous anesthesia was also reviewed. The risks                            and benefits of the procedure and the sedation                            options and risks were discussed with the patient.                            All questions were answered, and informed consent                            was obtained. Prior Anticoagulants: The patient has                            taken no anticoagulant or antiplatelet agents. ASA                            Grade Assessment: II - A patient with mild systemic                            disease. After reviewing the risks and benefits,                            the patient was deemed in satisfactory condition to                            undergo the procedure.  After obtaining informed consent, the colonoscope                            was passed under direct vision. Throughout the                            procedure, the patient's blood pressure, pulse, and                            oxygen saturations were monitored continuously. The                            CF [id number] #[id number] was introduced through the anus                            and advanced to the the  cecum, identified by                            appendiceal orifice and ileocecal valve. The                            colonoscopy was performed without difficulty. The                            patient tolerated the procedure well. The quality                            of the bowel preparation was excellent. The                            ileocecal valve, appendiceal orifice, and rectum                            were photographed. Scope In: 12:48:55 PM Scope Out: 1:01:43 PM Scope Withdrawal Time: 0 hours 9 minutes 49 seconds  Total Procedure Duration: 0 hours 12 minutes 48 seconds  Findings:                 The perianal and digital rectal examinations were                            normal.                           Repeat examination of right colon under NBI                            performed.                           A diminutive polyp was found in the ascending                            colon. The polyp was semi-sessile. The polyp was  removed with a cold snare. Resection and retrieval                            were complete.                           Internal hemorrhoids were found.                           The exam was otherwise without abnormality on                            direct and retroflexion views. Complications:            No immediate complications. Estimated Blood Loss:     Estimated blood loss was minimal. Impression:               - One diminutive polyp in the ascending colon,                            removed with a cold snare. Resected and retrieved.                           - Internal hemorrhoids.                           - The examination was otherwise normal on direct                            and retroflexion views. Recommendation:           - Patient has a contact number available for                            emergencies. The signs and symptoms of potential                            delayed complications were  discussed with the                            patient. Return to normal activities tomorrow.                            Written discharge instructions were provided to the                            patient.                           - Resume previous diet.                           - Continue present medications.                           - Await pathology results.                           -  Repeat colonoscopy is recommended for                            surveillance. The colonoscopy date will be                            determined after pathology results from today's                            exam become available for review. Reiley Keisler L. Myrtie Neither, MD 09/12/2022 1:05:56 PM This report has been signed electronically.

## 2022-09-13 ENCOUNTER — Telehealth: Payer: Self-pay | Admitting: *Deleted

## 2022-09-13 NOTE — Telephone Encounter (Signed)
Mailbox full ,unable to leave message on f/u call 

## 2022-09-14 ENCOUNTER — Encounter: Payer: Self-pay | Admitting: Gastroenterology

## 2023-12-21 DIAGNOSIS — M79661 Pain in right lower leg: Secondary | ICD-10-CM | POA: Diagnosis not present

## 2023-12-21 DIAGNOSIS — I83893 Varicose veins of bilateral lower extremities with other complications: Secondary | ICD-10-CM | POA: Diagnosis not present

## 2023-12-21 DIAGNOSIS — I872 Venous insufficiency (chronic) (peripheral): Secondary | ICD-10-CM | POA: Diagnosis not present

## 2023-12-21 DIAGNOSIS — M79604 Pain in right leg: Secondary | ICD-10-CM | POA: Diagnosis not present

## 2023-12-21 DIAGNOSIS — I87393 Chronic venous hypertension (idiopathic) with other complications of bilateral lower extremity: Secondary | ICD-10-CM | POA: Diagnosis not present

## 2023-12-21 DIAGNOSIS — M79662 Pain in left lower leg: Secondary | ICD-10-CM | POA: Diagnosis not present

## 2024-03-26 ENCOUNTER — Encounter (HOSPITAL_COMMUNITY): Payer: Self-pay

## 2024-03-26 ENCOUNTER — Ambulatory Visit (HOSPITAL_COMMUNITY)
Admission: EM | Admit: 2024-03-26 | Discharge: 2024-03-26 | Disposition: A | Discharge status: Home / Self Care | Source: Home / Self Care

## 2024-03-26 DIAGNOSIS — J22 Unspecified acute lower respiratory infection: Secondary | ICD-10-CM | POA: Diagnosis not present

## 2024-03-26 DIAGNOSIS — R051 Acute cough: Secondary | ICD-10-CM

## 2024-03-26 MED ORDER — PROMETHAZINE-DM 6.25-15 MG/5ML PO SYRP: 5.0000 mL | ORAL_SOLUTION | Freq: Four times a day (QID) | ORAL | 0 refills | Status: AC | PRN

## 2024-03-26 MED ORDER — AMOXICILLIN-POT CLAVULANATE 875-125 MG PO TABS: 1.0000 | ORAL_TABLET | Freq: Two times a day (BID) | ORAL | 0 refills | Status: AC

## 2024-03-26 NOTE — ED Triage Notes (Signed)
 Pt states headache,congestion,sore throat,cough,chills for the past week.  States he has been gargling with salt and warn water and taking tylenol.

## 2024-03-26 NOTE — ED Provider Notes (Signed)
 " [facility]    CSN: [id number] Arrival date & time: 03/26/24  1606      History   Chief Complaint Chief Complaint  Patient presents with   Nasal Congestion    HPI Jon Holmes is a 64 y.o. male.   64 year old male pt, Jon Holmes, presents to urgent care for evaluation of headache, congestion, sore throat, cough and chills for a week. Pt has been gargling warm salt water and taking tylenol for symptom management  EFY:HZMI,joozmhpzd  Deneis smoking,drinking,drug use, no abx last 30 days  The history is provided by the patient. No language interpreter was used.    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 Authorizing Provider  amoxicillin -clavulanate (AUGMENTIN ) 875-125 MG tablet Take 1 tablet by mouth every 12 (twelve) hours. 03/26/24  Yes Jessie Cowher, NP  promethazine -dextromethorphan (PROMETHAZINE -DM) 6.25-15 MG/5ML syrup Take 5 mLs by mouth 4 (four) times daily as needed for cough. 03/26/24  Yes Arianni Gallego, Rilla, NP  OVER THE COUNTER MEDICATION Calcium one capsule daily.    [provider]  OVER THE COUNTER MEDICATION Healthy heart support, two capsules daily.    [provider]  OVER THE COUNTER MEDICATION Take by mouth daily.    [provider]    Family History Family History  Problem Relation Age  of Onset   Hypertension Mother    Colon cancer Maternal Grandfather    Glaucoma Maternal Grandfather    Esophageal cancer Neg Hx    Rectal cancer Neg Hx    Stomach cancer Neg Hx     Social History Social History[1]   Allergies   Poison ivy extract and Shellfish allergy   Review of Systems Review of Systems  Constitutional:  Positive for chills.  HENT:  Positive for congestion and sore throat.   Respiratory:  Positive for cough.   Neurological:  Positive for headaches.  All other systems reviewed and are negative.    Physical Exam Triage Vital Signs ED Triage Vitals  Encounter Vitals Group     BP 03/26/24 1752 (!) 155/90     Girls Systolic BP Percentile --      Girls Diastolic BP Percentile --      Boys Systolic BP Percentile --      Boys Diastolic BP Percentile --      Pulse Rate 03/26/24 1752 73     Resp 03/26/24 1752 16     Temp 03/26/24 1752 98.3 F (36.8 C)     Temp Source 03/26/24 1752 Oral     SpO2 03/26/24 1752 95 %     Weight --      Height --      Head Circumference --      Peak Flow --      Pain Score 03/26/24 1751 8  Pain Loc --      Pain Education --      Exclude from Growth Chart --    No data found.  Updated Vital Signs BP (!) 155/90 (BP Location: Left Arm)   Pulse 73   Temp 98.3 F (36.8 C) (Oral)   Resp 16   SpO2 95%   Visual Acuity Right Eye Distance:   Left Eye Distance:   Bilateral Distance:    Right Eye Near:   Left Eye Near:    Bilateral Near:     Physical Exam Vitals and nursing note reviewed.  Constitutional:      General: He is not in acute distress.    Appearance: He is well-developed. He is not ill-appearing or toxic-appearing.  HENT:     Head: Normocephalic.     Right Ear: Tympanic membrane is retracted.     Left Ear: Tympanic membrane is retracted.     Nose: Mucosal edema and congestion present.     Right Sinus: Maxillary sinus tenderness present.     Left Sinus: Maxillary sinus tenderness present.      Mouth/Throat:     Lips: Pink.     Mouth: Mucous membranes are moist.     Pharynx: Uvula midline. Postnasal drip present.  Eyes:     General: Lids are normal.     Conjunctiva/sclera: Conjunctivae normal.     Pupils: Pupils are equal, round, and reactive to light.  Cardiovascular:     Rate and Rhythm: Normal rate and regular rhythm.     Heart sounds: Normal heart sounds.  Pulmonary:     Effort: Pulmonary effort is normal. No respiratory distress.     Breath sounds: Normal breath sounds. No decreased breath sounds or wheezing.  Abdominal:     General: There is no distension.     Palpations: Abdomen is soft.  Musculoskeletal:        General: Normal range of motion.     Cervical back: Normal range of motion.  Skin:    General: Skin is warm and dry.     Findings: No rash.  Neurological:     General: No focal deficit present.     Mental Status: He is alert and oriented to person, place, and time.     GCS: GCS eye subscore is 4. GCS verbal subscore is 5. GCS motor subscore is 6.     Cranial Nerves: No cranial nerve deficit.     Sensory: No sensory deficit.  Psychiatric:        Attention and Perception: Attention normal.        Mood and Affect: Mood normal.        Speech: Speech normal.        Behavior: Behavior normal. Behavior is cooperative.      UC Treatments / Results  Labs (all labs ordered are listed, but only abnormal results are displayed) Labs Reviewed - No data to display  EKG   Radiology No results found.  Procedures Procedures (including critical care time)  Medications Ordered in UC Medications - No data to display  Initial Impression / Assessment and Plan / UC Course  I have reviewed the triage vital signs and the nursing notes.  Pertinent labs & imaging results that were available during my care of the patient were reviewed by me and considered in my medical decision making (see chart for details).     Discussed exam findings and plan of care with  pt: We are treating you for  an acute respiratory infection.Rest,Take augmentin /phenergan  Dm  as prescribed. Drink plenty of water, avoid caffeine. Follow up with PCP.  Patient verbalized understanding to this provider  Ddx: Acute respiratory infection, sinusitis, viral illness,allergies Final Clinical Impressions(s) / UC Diagnoses   Final diagnoses:  Acute respiratory infection  Acute cough     Discharge Instructions      We are treating you for an acute respiratory infection.Rest,Take augmentin  as prescribed. Drink plenty of water, avoid caffeine. Follow up with PCP.     ED Prescriptions     Medication Sig Dispense Auth. Provider   amoxicillin -clavulanate (AUGMENTIN ) 875-125 MG tablet Take 1 tablet by mouth every 12 (twelve) hours. 14 tablet Bralon Antkowiak, NP   promethazine -dextromethorphan (PROMETHAZINE -DM) 6.25-15 MG/5ML syrup Take 5 mLs by mouth 4 (four) times daily as needed for cough. 118 mL Gracelynn Bircher, NP      PDMP not reviewed this encounter.     [1]  Social History Tobacco Use   Smoking status: Never   Smokeless tobacco: Never  Vaping Use   Vaping status: Never Used  Substance Use Topics   Alcohol use: Yes    Comment: weekends   Drug use: No     Aminta Loose, NP 03/26/24 2028  "
